# Patient Record
Sex: Male | Born: 1950 | Race: Black or African American | Hispanic: No | Marital: Single | Smoking: Never smoker
Health system: Southern US, Community
[De-identification: ages and names within clinical notes are randomized; demographics above are authoritative.]

## PROBLEM LIST (undated history)

## (undated) DIAGNOSIS — E785 Hyperlipidemia, unspecified: Secondary | ICD-10-CM

## (undated) DIAGNOSIS — M169 Osteoarthritis of hip, unspecified: Secondary | ICD-10-CM

## (undated) DIAGNOSIS — I10 Essential (primary) hypertension: Secondary | ICD-10-CM

## (undated) DIAGNOSIS — H539 Unspecified visual disturbance: Secondary | ICD-10-CM

## (undated) DIAGNOSIS — S82409A Unspecified fracture of shaft of unspecified fibula, initial encounter for closed fracture: Secondary | ICD-10-CM

## (undated) DIAGNOSIS — K047 Periapical abscess without sinus: Secondary | ICD-10-CM

## (undated) HISTORY — DX: Unspecified visual disturbance: H53.9

## (undated) HISTORY — PX: TONSILLECTOMY: SUR1361

## (undated) HISTORY — DX: Hyperlipidemia, unspecified: E78.5

## (undated) HISTORY — DX: Osteoarthritis of hip, unspecified: M16.9

## (undated) HISTORY — DX: Unspecified fracture of shaft of unspecified fibula, initial encounter for closed fracture: S82.409A

---

## 2002-02-06 ENCOUNTER — Ambulatory Visit (HOSPITAL_COMMUNITY): Admission: RE | Admit: 2002-02-06 | Discharge: 2002-02-06 | Payer: Self-pay | Admitting: *Deleted

## 2002-02-06 HISTORY — PX: COLONOSCOPY: SHX174

## 2008-03-24 ENCOUNTER — Ambulatory Visit: Payer: Self-pay | Admitting: Internal Medicine

## 2008-03-24 LAB — CONVERTED CEMR LAB
AST: 29 units/L (ref 0–37)
BUN: 10 mg/dL (ref 6–23)
Calcium: 9.7 mg/dL (ref 8.4–10.5)
Chloride: 102 meq/L (ref 96–112)
Cholesterol: 186 mg/dL (ref 0–200)
Creatinine, Ser: 0.88 mg/dL (ref 0.40–1.50)
HDL: 75 mg/dL (ref >39)
Total CHOL/HDL Ratio: 2.5

## 2008-05-11 ENCOUNTER — Ambulatory Visit: Payer: Self-pay | Admitting: Internal Medicine

## 2008-05-12 ENCOUNTER — Ambulatory Visit: Payer: Self-pay | Admitting: *Deleted

## 2008-05-14 ENCOUNTER — Ambulatory Visit: Payer: Self-pay | Admitting: Internal Medicine

## 2008-06-24 ENCOUNTER — Ambulatory Visit: Payer: Self-pay | Admitting: Internal Medicine

## 2008-07-21 ENCOUNTER — Ambulatory Visit: Payer: Self-pay | Admitting: Internal Medicine

## 2008-08-18 ENCOUNTER — Ambulatory Visit: Payer: Self-pay | Admitting: Internal Medicine

## 2008-09-28 ENCOUNTER — Ambulatory Visit: Payer: Self-pay | Admitting: Internal Medicine

## 2008-10-28 ENCOUNTER — Ambulatory Visit: Payer: Self-pay | Admitting: Internal Medicine

## 2008-12-22 ENCOUNTER — Ambulatory Visit: Payer: Self-pay | Admitting: Internal Medicine

## 2009-01-19 ENCOUNTER — Ambulatory Visit: Payer: Self-pay | Admitting: Internal Medicine

## 2009-02-23 ENCOUNTER — Ambulatory Visit: Payer: Self-pay | Admitting: Internal Medicine

## 2009-08-16 ENCOUNTER — Ambulatory Visit: Payer: Self-pay | Admitting: Internal Medicine

## 2010-03-16 ENCOUNTER — Ambulatory Visit: Payer: Self-pay | Admitting: Internal Medicine

## 2010-03-16 LAB — CONVERTED CEMR LAB
BUN: 14 mg/dL (ref 6–23)
CO2: 30 meq/L (ref 19–32)
Chloride: 98 meq/L (ref 96–112)
Glucose, Bld: 89 mg/dL (ref 70–99)
Potassium: 3.8 meq/L (ref 3.5–5.3)

## 2010-11-13 ENCOUNTER — Encounter (INDEPENDENT_AMBULATORY_CARE_PROVIDER_SITE_OTHER): Payer: Self-pay | Admitting: Family Medicine

## 2010-11-13 LAB — CONVERTED CEMR LAB
BUN: 8 mg/dL (ref 6–23)
CO2: 27 meq/L (ref 19–32)
Calcium: 9.3 mg/dL (ref 8.4–10.5)
Chloride: 102 meq/L (ref 96–112)
Creatinine, Ser: 0.86 mg/dL (ref 0.40–1.50)
Glucose, Bld: 84 mg/dL (ref 70–99)
Vit D, 25-Hydroxy: 12 ng/mL — ABNORMAL LOW (ref 30–89)

## 2010-12-26 ENCOUNTER — Ambulatory Visit (HOSPITAL_COMMUNITY)
Admission: RE | Admit: 2010-12-26 | Discharge: 2010-12-26 | Disposition: A | Discharge status: Home / Self Care | Payer: Self-pay | Source: Ambulatory Visit | Attending: Family Medicine | Admitting: Family Medicine

## 2010-12-26 DIAGNOSIS — I517 Cardiomegaly: Secondary | ICD-10-CM

## 2010-12-26 DIAGNOSIS — I10 Essential (primary) hypertension: Secondary | ICD-10-CM | POA: Insufficient documentation

## 2010-12-26 DIAGNOSIS — I079 Rheumatic tricuspid valve disease, unspecified: Secondary | ICD-10-CM | POA: Insufficient documentation

## 2010-12-26 DIAGNOSIS — I059 Rheumatic mitral valve disease, unspecified: Secondary | ICD-10-CM | POA: Insufficient documentation

## 2010-12-26 DIAGNOSIS — R9431 Abnormal electrocardiogram [ECG] [EKG]: Secondary | ICD-10-CM | POA: Insufficient documentation

## 2011-01-26 NOTE — Procedures (Signed)
[facility]  Patient:    Frank [name], Frank [name] Visit Number: [id number] MRN: [id number]          Service Type: END Location: ENDO Attending Physician:  Sabino Gasser Dictated by:   Sabino Gasser, M.D. Proc. Date: 02/06/02 Admit Date:  02/06/2002                             Procedure Report  PROCEDURE:  Colonoscopy.  INDICATIONS:  Colon cancer screening, rectal bleeding.  ANESTHESIA:  Demerol 75, Versed 6 mg.  DESCRIPTION OF PROCEDURE:  With the patient mildly sedated in the left lateral decubitus position, a rectal exam was performed which was unremarkable. Subsequently, the Olympus videoscopic colonoscope was inserted into the rectum and passed under direct vision to the cecum, identified by the ileocecal valve and appendiceal orifice, both of which were photographed.  From this point, the colonoscope was slowly withdrawn, taking circumferential views of the entire colonic mucosa, stopping only in the rectum which appeared normal on direct and showed hemorrhoids on retroflexed view.  The endoscope was straightened and withdrawn.  The patients vital signs and pulse oximeter remained stable.  The patient tolerated the procedure well without apparent complications.  FINDINGS:  Internal hemorrhoids, otherwise unremarkable examination to the cecum. Dictated by:   Sabino Gasser, M.D. Attending Physician:  Sabino Gasser DD:  02/06/02 TD:  02/07/02 Job: [id number]

## 2011-04-23 ENCOUNTER — Ambulatory Visit (HOSPITAL_COMMUNITY)
Admission: RE | Admit: 2011-04-23 | Discharge: 2011-04-23 | Disposition: A | Discharge status: Home / Self Care | Payer: Self-pay | Source: Ambulatory Visit | Attending: Family Medicine | Admitting: Family Medicine

## 2011-04-23 ENCOUNTER — Other Ambulatory Visit (HOSPITAL_COMMUNITY): Payer: Self-pay | Admitting: Family Medicine

## 2011-04-23 ENCOUNTER — Other Ambulatory Visit: Payer: Self-pay | Admitting: Family Medicine

## 2011-04-23 DIAGNOSIS — M169 Osteoarthritis of hip, unspecified: Secondary | ICD-10-CM | POA: Insufficient documentation

## 2011-04-23 DIAGNOSIS — M25552 Pain in left hip: Secondary | ICD-10-CM

## 2011-04-23 DIAGNOSIS — M1610 Unilateral primary osteoarthritis, unspecified hip: Secondary | ICD-10-CM | POA: Insufficient documentation

## 2011-04-26 ENCOUNTER — Ambulatory Visit (HOSPITAL_COMMUNITY)
Admission: RE | Admit: 2011-04-26 | Discharge: 2011-04-26 | Disposition: A | Discharge status: Home / Self Care | Payer: Self-pay | Source: Ambulatory Visit | Attending: Family Medicine | Admitting: Family Medicine

## 2011-04-26 DIAGNOSIS — M169 Osteoarthritis of hip, unspecified: Secondary | ICD-10-CM | POA: Insufficient documentation

## 2011-04-26 DIAGNOSIS — M25552 Pain in left hip: Secondary | ICD-10-CM

## 2011-04-26 DIAGNOSIS — M1610 Unilateral primary osteoarthritis, unspecified hip: Secondary | ICD-10-CM | POA: Insufficient documentation

## 2011-09-11 DIAGNOSIS — S82409A Unspecified fracture of shaft of unspecified fibula, initial encounter for closed fracture: Secondary | ICD-10-CM

## 2011-09-11 HISTORY — DX: Unspecified fracture of shaft of unspecified fibula, initial encounter for closed fracture: S82.409A

## 2011-11-05 ENCOUNTER — Other Ambulatory Visit (HOSPITAL_COMMUNITY): Payer: Self-pay | Admitting: Family Medicine

## 2011-11-05 ENCOUNTER — Ambulatory Visit (HOSPITAL_COMMUNITY)
Admission: RE | Admit: 2011-11-05 | Discharge: 2011-11-05 | Disposition: A | Discharge status: Home / Self Care | Payer: Self-pay | Source: Ambulatory Visit | Attending: Family Medicine | Admitting: Family Medicine

## 2011-11-05 DIAGNOSIS — Z9181 History of falling: Secondary | ICD-10-CM | POA: Insufficient documentation

## 2011-11-05 DIAGNOSIS — M25579 Pain in unspecified ankle and joints of unspecified foot: Secondary | ICD-10-CM | POA: Insufficient documentation

## 2011-11-05 DIAGNOSIS — R52 Pain, unspecified: Secondary | ICD-10-CM

## 2012-08-17 ENCOUNTER — Emergency Department (HOSPITAL_COMMUNITY)
Admission: EM | Admit: 2012-08-17 | Discharge: 2012-08-17 | Disposition: A | Discharge status: Home / Self Care | Payer: Self-pay | Attending: Emergency Medicine | Admitting: Emergency Medicine

## 2012-08-17 ENCOUNTER — Encounter (HOSPITAL_COMMUNITY): Payer: Self-pay | Admitting: Emergency Medicine

## 2012-08-17 DIAGNOSIS — Z79899 Other long term (current) drug therapy: Secondary | ICD-10-CM | POA: Insufficient documentation

## 2012-08-17 DIAGNOSIS — K047 Periapical abscess without sinus: Secondary | ICD-10-CM | POA: Insufficient documentation

## 2012-08-17 DIAGNOSIS — I10 Essential (primary) hypertension: Secondary | ICD-10-CM | POA: Insufficient documentation

## 2012-08-17 DIAGNOSIS — R220 Localized swelling, mass and lump, head: Secondary | ICD-10-CM | POA: Insufficient documentation

## 2012-08-17 DIAGNOSIS — R221 Localized swelling, mass and lump, neck: Secondary | ICD-10-CM | POA: Insufficient documentation

## 2012-08-17 HISTORY — DX: Essential (primary) hypertension: I10

## 2012-08-17 MED ORDER — HYDROCODONE-ACETAMINOPHEN 5-325 MG PO TABS: 2.0000 | ORAL_TABLET | ORAL | Status: DC | PRN

## 2012-08-17 MED ORDER — PENICILLIN V POTASSIUM 500 MG PO TABS: 500.0000 mg | ORAL_TABLET | Freq: Three times a day (TID) | ORAL | Status: DC

## 2012-08-17 NOTE — ED Notes (Signed)
Patient has large amount of swelling to right jaw x 1 week. The patient reports that he has had a "bad" tooth there

## 2012-08-17 NOTE — ED Provider Notes (Signed)
History     CSN: [id number]  Arrival date & time 08/17/12  0714   First MD Initiated Contact with Patient 08/17/12 [phone]      Chief Complaint  Patient presents with  . Oral Swelling  . Dental Pain     HPI Patient has large amount of swelling to right jaw x 1 week. The patient reports that he has had a "bad" tooth there   Past Medical History  Diagnosis Date  . Hypertension     History reviewed. No pertinent past surgical history.  No family history on file.  History  Substance Use Topics  . Smoking status: Never Smoker   . Smokeless tobacco: Not on file  . Alcohol Use: No      Review of Systems All other systems reviewed and are negative Allergies  Review of patient's allergies indicates no known allergies.  Home Medications   Current Outpatient Rx  Name  Route  Sig  Dispense  Refill  . ACETAMINOPHEN 500 MG PO TABS   Oral   Take 1,000 mg by mouth every 6 (six) hours as needed. Pain         . DILTIAZEM HCL ER BEADS 240 MG PO CP24   Oral   Take 240 mg by mouth daily.         Marland Kitchen HYDROCHLOROTHIAZIDE 25 MG PO TABS   Oral   Take 25 mg by mouth daily.         Marland Kitchen METOPROLOL SUCCINATE ER 100 MG PO TB24   Oral   Take 100 mg by mouth daily. Take with or immediately following a meal.         . NAPROXEN SODIUM 220 MG PO TABS   Oral   Take 440 mg by mouth 2 (two) times daily with a meal.         . HYDROCODONE-ACETAMINOPHEN 5-325 MG PO TABS   Oral   Take 2 tablets by mouth every 4 (four) hours as needed for pain.   10 tablet   0   . PENICILLIN V POTASSIUM 500 MG PO TABS   Oral   Take 1 tablet (500 mg total) by mouth 3 (three) times daily.   30 tablet   0     BP 154/98  Pulse 121  Temp 97.5 F (36.4 C) (Oral)  Resp 18  SpO2 97%  Physical Exam  Nursing note and vitals reviewed. Constitutional: He is oriented to person, place, and time. He appears well-developed and well-nourished. No distress.  HENT:  Head: Normocephalic and atraumatic.    Mouth/Throat: Uvula is midline, oropharynx is clear and moist and mucous membranes are normal. Dental abscesses and dental caries present.    Eyes: Pupils are equal, round, and reactive to light.  Neck: Normal range of motion.  Cardiovascular: Normal rate and intact distal pulses.   Pulmonary/Chest: No respiratory distress.  Abdominal: Normal appearance. He exhibits no distension.  Musculoskeletal: Normal range of motion.  Neurological: He is alert and oriented to person, place, and time. No cranial nerve deficit.  Skin: Skin is warm and dry. No rash noted.  Psychiatric: He has a normal mood and affect. His behavior is normal.    ED Course  Procedures (including critical care time)  Labs Reviewed - No data to display No results found.   1. Abscessed tooth       MDM         Nelia Shi, MD 08/17/12 [phone]

## 2012-08-17 NOTE — ED Notes (Signed)
Pt sts lower right dental pain, facial swelling and difficulty chewing x 1 week.  Denies difficulty breathing.  Sts he does not have a dentist.  Pain score 10/10.

## 2012-08-20 ENCOUNTER — Inpatient Hospital Stay (HOSPITAL_COMMUNITY)
Admission: EM | Admit: 2012-08-20 | Discharge: 2012-08-27 | DRG: 159 | Disposition: A | Discharge status: Home / Self Care | Payer: MEDICAID | Attending: Family Medicine | Admitting: Family Medicine

## 2012-08-20 ENCOUNTER — Encounter (HOSPITAL_COMMUNITY): Payer: Self-pay | Admitting: *Deleted

## 2012-08-20 ENCOUNTER — Emergency Department (HOSPITAL_COMMUNITY): Payer: Self-pay

## 2012-08-20 DIAGNOSIS — E876 Hypokalemia: Secondary | ICD-10-CM | POA: Diagnosis present

## 2012-08-20 DIAGNOSIS — K047 Periapical abscess without sinus: Secondary | ICD-10-CM

## 2012-08-20 DIAGNOSIS — I10 Essential (primary) hypertension: Secondary | ICD-10-CM

## 2012-08-20 DIAGNOSIS — M272 Inflammatory conditions of jaws: Secondary | ICD-10-CM

## 2012-08-20 DIAGNOSIS — Z23 Encounter for immunization: Secondary | ICD-10-CM

## 2012-08-20 DIAGNOSIS — E860 Dehydration: Secondary | ICD-10-CM | POA: Diagnosis present

## 2012-08-20 DIAGNOSIS — K113 Abscess of salivary gland: Secondary | ICD-10-CM

## 2012-08-20 HISTORY — DX: Periapical abscess without sinus: K04.7

## 2012-08-20 LAB — CBC WITH DIFFERENTIAL/PLATELET
Basophils Absolute: 0 10*3/uL (ref 0.0–0.1)
Basophils Relative: 0 % (ref 0–1)
Eosinophils Absolute: 0 10*3/uL (ref 0.0–0.7)
Eosinophils Relative: 0 % (ref 0–5)
HCT: 38.8 % — ABNORMAL LOW (ref 39.0–52.0)
Hemoglobin: 12.3 g/dL — ABNORMAL LOW (ref 13.0–17.0)
Lymphocytes Relative: 8 % — ABNORMAL LOW (ref 12–46)
Lymphs Abs: 1 10*3/uL (ref 0.7–4.0)
MCH: 27.2 pg (ref 26.0–34.0)
MCHC: 31.7 g/dL (ref 30.0–36.0)
MCV: 85.8 fL (ref 78.0–100.0)
Monocytes Absolute: 1.5 10*3/uL — ABNORMAL HIGH (ref 0.1–1.0)
Monocytes Relative: 11 % (ref 3–12)
Neutro Abs: 10.8 10*3/uL — ABNORMAL HIGH (ref 1.7–7.7)
Neutrophils Relative %: 81 % — ABNORMAL HIGH (ref 43–77)
Platelets: 316 10*3/uL (ref 150–400)
RBC: 4.52 MIL/uL (ref 4.22–5.81)
RDW: 13 % (ref 11.5–15.5)
WBC: 13.3 10*3/uL — ABNORMAL HIGH (ref 4.0–10.5)

## 2012-08-20 LAB — BASIC METABOLIC PANEL
BUN: 12 mg/dL (ref 6–23)
CO2: 31 mEq/L (ref 19–32)
Calcium: 9.6 mg/dL (ref 8.4–10.5)
Chloride: 96 mEq/L (ref 96–112)
Creatinine, Ser: 0.69 mg/dL (ref 0.50–1.35)
GFR calc Af Amer: >90 mL/min (ref >90)
GFR calc non Af Amer: >90 mL/min (ref >90)
Glucose, Bld: 140 mg/dL — ABNORMAL HIGH (ref 70–99)
Potassium: 3.4 mEq/L — ABNORMAL LOW (ref 3.5–5.1)
Sodium: 138 mEq/L (ref 135–145)

## 2012-08-20 LAB — COMPREHENSIVE METABOLIC PANEL
ALT: 27 U/L (ref 0–53)
AST: 31 U/L (ref 0–37)
Albumin: 2.8 g/dL — ABNORMAL LOW (ref 3.5–5.2)
Alkaline Phosphatase: 67 U/L (ref 39–117)
BUN: 11 mg/dL (ref 6–23)
CO2: 28 mEq/L (ref 19–32)
Calcium: 9 mg/dL (ref 8.4–10.5)
Chloride: 98 mEq/L (ref 96–112)
Creatinine, Ser: 0.67 mg/dL (ref 0.50–1.35)
GFR calc Af Amer: >90 mL/min (ref >90)
GFR calc non Af Amer: >90 mL/min (ref >90)
Glucose, Bld: 102 mg/dL — ABNORMAL HIGH (ref 70–99)
Potassium: 3.4 mEq/L — ABNORMAL LOW (ref 3.5–5.1)
Sodium: 136 mEq/L (ref 135–145)
Total Bilirubin: 0.9 mg/dL (ref 0.3–1.2)
Total Protein: 7.5 g/dL (ref 6.0–8.3)

## 2012-08-20 LAB — APTT: aPTT: 45 seconds — ABNORMAL HIGH (ref 24–37)

## 2012-08-20 LAB — PROTIME-INR
INR: 1.21 (ref 0.00–1.49)
Prothrombin Time: 15.1 seconds (ref 11.6–15.2)

## 2012-08-20 LAB — TYPE AND SCREEN
ABO/RH(D): O POS
Antibody Screen: NEGATIVE

## 2012-08-20 MED ORDER — IOHEXOL 300 MG/ML  SOLN
80.0000 mL | Freq: Once | INTRAMUSCULAR | Status: AC | PRN
  Administered 2012-08-20: 80 mL via INTRAVENOUS

## 2012-08-20 MED ORDER — SENNA 8.6 MG PO TABS
1.0000 | ORAL_TABLET | Freq: Two times a day (BID) | ORAL | Status: DC
  Administered 2012-08-20 – 2012-08-27 (×14): 8.6 mg via ORAL
  Filled 2012-08-20 (×19): qty 1

## 2012-08-20 MED ORDER — ACETAMINOPHEN 650 MG RE SUPP: 650.0000 mg | Freq: Four times a day (QID) | RECTAL | Status: DC | PRN

## 2012-08-20 MED ORDER — MORPHINE SULFATE 2 MG/ML IJ SOLN
2.0000 mg | INTRAMUSCULAR | Status: DC | PRN
  Administered 2012-08-20 – 2012-08-21 (×3): 2 mg via INTRAVENOUS
  Administered 2012-08-21: 4 mg via INTRAVENOUS
  Filled 2012-08-20 (×2): qty 1
  Filled 2012-08-20: qty 2
  Filled 2012-08-20: qty 1

## 2012-08-20 MED ORDER — METOPROLOL SUCCINATE ER 100 MG PO TB24
100.0000 mg | ORAL_TABLET | Freq: Every day | ORAL | Status: DC
  Administered 2012-08-21 – 2012-08-27 (×7): 100 mg via ORAL
  Filled 2012-08-20 (×7): qty 1

## 2012-08-20 MED ORDER — POLYETHYLENE GLYCOL 3350 17 G PO PACK
17.0000 g | PACK | Freq: Every day | ORAL | Status: DC | PRN
  Filled 2012-08-20: qty 1

## 2012-08-20 MED ORDER — DILTIAZEM HCL ER BEADS 240 MG PO CP24
240.0000 mg | ORAL_CAPSULE | Freq: Every day | ORAL | Status: DC
  Administered 2012-08-21 – 2012-08-27 (×7): 240 mg via ORAL
  Filled 2012-08-20 (×7): qty 1

## 2012-08-20 MED ORDER — CLINDAMYCIN PHOSPHATE 900 MG/50ML IV SOLN
900.0000 mg | Freq: Once | INTRAVENOUS | Status: AC
  Administered 2012-08-20: 900 mg via INTRAVENOUS
  Filled 2012-08-20: qty 50

## 2012-08-20 MED ORDER — CLINDAMYCIN PHOSPHATE 600 MG/50ML IV SOLN
600.0000 mg | Freq: Four times a day (QID) | INTRAVENOUS | Status: DC
  Administered 2012-08-21 – 2012-08-25 (×18): 600 mg via INTRAVENOUS
  Filled 2012-08-20 (×20): qty 50

## 2012-08-20 MED ORDER — SODIUM CHLORIDE 0.9 % IV BOLUS (SEPSIS)
1000.0000 mL | Freq: Once | INTRAVENOUS | Status: AC
  Administered 2012-08-20: 1000 mL via INTRAVENOUS

## 2012-08-20 MED ORDER — ONDANSETRON 4 MG PO TBDP
4.0000 mg | ORAL_TABLET | Freq: Once | ORAL | Status: AC
  Administered 2012-08-20: 4 mg via ORAL
  Filled 2012-08-20: qty 1

## 2012-08-20 MED ORDER — HYDROMORPHONE HCL PF 1 MG/ML IJ SOLN
1.0000 mg | Freq: Once | INTRAMUSCULAR | Status: AC
  Administered 2012-08-20: 1 mg via INTRAVENOUS
  Filled 2012-08-20: qty 1

## 2012-08-20 MED ORDER — ONDANSETRON HCL 4 MG/2ML IJ SOLN: 4.0000 mg | Freq: Four times a day (QID) | INTRAMUSCULAR | Status: DC | PRN

## 2012-08-20 MED ORDER — ONDANSETRON HCL 4 MG PO TABS: 4.0000 mg | ORAL_TABLET | Freq: Four times a day (QID) | ORAL | Status: DC | PRN

## 2012-08-20 MED ORDER — INFLUENZA VIRUS VACC SPLIT PF IM SUSP
0.5000 mL | INTRAMUSCULAR | Status: AC
  Filled 2012-08-20: qty 0.5

## 2012-08-20 MED ORDER — POTASSIUM CHLORIDE IN NACL 40-0.9 MEQ/L-% IV SOLN
INTRAVENOUS | Status: DC
  Administered 2012-08-20 – 2012-08-22 (×3): via INTRAVENOUS
  Filled 2012-08-20 (×7): qty 1000

## 2012-08-20 MED ORDER — ACETAMINOPHEN 325 MG PO TABS: 650.0000 mg | ORAL_TABLET | Freq: Four times a day (QID) | ORAL | Status: DC | PRN

## 2012-08-20 NOTE — ED Notes (Signed)
Attempted to call report x1.  Nurse and charge nurse unavailable, will call back in 5 minutes.

## 2012-08-20 NOTE — ED Provider Notes (Signed)
History     CSN: [id number]  Arrival date & time 08/20/12  1221   First MD Initiated Contact with Patient 08/20/12 1511      Chief Complaint  Patient presents with  . Abscess    (Consider location/radiation/quality/duration/timing/severity/associated sxs/prior treatment) HPI Comments: Frank [name] 61 y.o. male   The chief complaint is: Patient presents with:   Abscess   61 year old male presents to emergency department with chief complaint of right tooth abscess.  He was seen 2 days ago at [facility] long emergency department and given Pen-Vee K 500 mg 3 times a day as well as Norco.  He has had no resolution of symptoms but no worsening.  His airway is patent he has no difficulty swallowing.  He was unable to obtain outside followup for drainage of his abscess.  Denies fevers, chills, myalgias, arthralgias. Denies DOE, SOB, chest tightness or pressure, radiation to left arm, jaw or back, or diaphoresis. Denies dysuria, flank pain, suprapubic pain, frequency, urgency, or hematuria. Denies headaches, light headedness, weakness, visual disturbances. Denies abdominal pain, nausea, vomiting, diarrhea or constipation.       Patient is a 61 y.o. male presenting with abscess. The history is provided by the patient and medical records. No language interpreter was used.  Abscess     Past Medical History  Diagnosis Date  . Hypertension     History reviewed. No pertinent past surgical history.  No family history on file.  History  Substance Use Topics  . Smoking status: Never Smoker   . Smokeless tobacco: Not on file  . Alcohol Use: No      Review of Systems  All other systems reviewed and are negative.  Ten systems are reviewed and are negative for acute change except as noted in the HPI   Allergies  Review of patient's allergies indicates no known allergies.  Home Medications   Current Outpatient Rx  Name  Route  Sig  Dispense  Refill  .  ACETAMINOPHEN 500 MG PO TABS   Oral   Take 1,000 mg by mouth every 6 (six) hours as needed. Pain         . DILTIAZEM HCL ER BEADS 240 MG PO CP24   Oral   Take 240 mg by mouth daily.         Marland Kitchen HYDROCHLOROTHIAZIDE 25 MG PO TABS   Oral   Take 25 mg by mouth daily.         Marland Kitchen HYDROCODONE-ACETAMINOPHEN 5-325 MG PO TABS   Oral   Take 2 tablets by mouth every 4 (four) hours as needed for pain.   10 tablet   0   . METOPROLOL SUCCINATE ER 100 MG PO TB24   Oral   Take 100 mg by mouth daily. Take with or immediately following a meal.         . NAPROXEN SODIUM 220 MG PO TABS   Oral   Take 440 mg by mouth 2 (two) times daily with a meal.         . PENICILLIN V POTASSIUM 500 MG PO TABS   Oral   Take 1 tablet (500 mg total) by mouth 3 (three) times daily.   30 tablet   0     BP 119/87  Pulse 110  Temp 98.1 F (36.7 C) (Oral)  Resp 18  SpO2 97%  Physical Exam  Nursing note and vitals reviewed. Constitutional: He appears well-developed and well-nourished. No distress.  HENT:  Head: Normocephalic and  atraumatic.         Patient has significant poor dentition.  He has erythema and swelling of the vehicle move toes that extends back to the posterior jaw line.  Uvula is midline and nonerythematous.  The tonsil do not have erythema his airway is patent  Eyes: Conjunctivae normal are normal. No scleral icterus.  Neck: Normal range of motion. Neck supple.  Cardiovascular: Normal rate, regular rhythm and normal heart sounds.   Pulmonary/Chest: Effort normal and breath sounds normal. No respiratory distress.  Abdominal: Soft. There is no tenderness.  Musculoskeletal: He exhibits no edema.  Neurological: He is alert.  Skin: Skin is warm and dry. He is not diaphoretic.  Psychiatric: His behavior is normal.    ED Course  Procedures (including critical care time)  Labs Reviewed  CBC WITH DIFFERENTIAL - Abnormal; Notable for the following:    WBC 13.3 (*)     Hemoglobin  12.3 (*)     HCT 38.8 (*)     Neutrophils Relative 81 (*)     Neutro Abs 10.8 (*)     Lymphocytes Relative 8 (*)     Monocytes Absolute 1.5 (*)     All other components within normal limits  BASIC METABOLIC PANEL - Abnormal; Notable for the following:    Potassium 3.4 (*)     Glucose, Bld 140 (*)     All other components within normal limits   Ct Maxillofacial W/cm  08/20/2012  *RADIOLOGY REPORT*  Clinical Data: Right dental abscess with neck extension.  CT MAXILLOFACIAL WITH CONTRAST  Technique:  Multidetector CT imaging of the maxillofacial structures was performed with intravenous contrast. Multiplanar CT image reconstructions were also generated.  Contrast: 80mL OMNIPAQUE IOHEXOL 300 MG/ML  SOLN  Comparison: None.  Findings: Large fluid collection in the right masticator space compatible with an abscess.  This has central fluid density and enhancement of the wall with irregular margins.  This extends into the masseter  muscle and into the pterygoid muscles.  The mass extends posterior to the angle of the mandible were it measures approximately 5.4 x 3.2 cm.  There is additional abscess extending medial to the mandible.  There is mass effect and displacement of the oropharynx to the left.  No compromise of the airway.  The mass is adjacent to and  displaces the right submandibular gland.  The floor of the mouth appears intact.  The paranasal sinuses are clear.  There is lucency in the right lower third molar compatible with dental infection.  No definite peri apical abscess is present. Nevertheless, based on the location of the abscess, it is most likely dental in origin.  No recent extraction is identified.  IMPRESSION: Large abscess in the masticator space on the right.  Based on the location, this is most likely dental in origin however no definite peri apical abscess is present.  Dental caries are present in the right lower third molar.  The oropharynx is displaced to the left but there is no  compromise of the airway.   Original Report Authenticated By: Janeece Riggers, M.D.      No diagnosis found.    MDM  3:30 PM BP 119/87  Pulse 110  Temp 98.1 F (36.7 C) (Oral)  Resp 18  SpO2 97% Patient with dental abscess he was unable to obtain outpatient followup.  He is gross deformity of his right lower jaw to 2 swelling.  Patient does have tachycardia and leukocytosis of 13 but biters vital signs  are otherwise stable he is afebrile   7:12 PM patient with 5.5 x 3 cm mandibular abscess with extension to the posterior pharynx and mass effect.  I have begun 900 mg IV clindamycin.  I spoke with Dr. Geralyn Flash wil  come to see the patient but has asked that the hospitalist group to admit for observation and a period of "cooling off" before surgery.  Patient will be admitted by family medicine. Repeat dose ABX and OMF surgery consult.     Arthor Captain, PA-C 08/22/12 1022

## 2012-08-20 NOTE — H&P (Signed)
Family Medicine Teaching [facility] Admission History and Physical  Patient name: Frank [name] Medical record number: [id number] Date of birth: 12-25-1950 Age: 61 y.o. Gender: male  Primary Care Provider: Dow Adolph, MD  Chief Complaint: R cheek pain  History of Present Illness: Frank [name] is a 61 y.o. year old male presenting with 2 wk h/o R cheek pain. Pain and swelling started 2 weeks ago. Pt w/ history of previous cavities. Pt receives [facility] care at health department but was told on initial evaluation that they were unable to treat his condition. Pt presented to [facility] on 12/8 after his pain and swelling progressed. Pt evaluated and treated w/ Pen VK and vicodin. Swelling and pain have progressed since that time. Pt now w/ limited movement of jaw and decreased PO. Denies fever, rash, n/v, syncope, lightheadedenss, SOB, CP, HA, palpitations, general malaise, hematemasis, or purulent discharge from mouth.   Review Of Systems: Per HPI all other systems negative  There is no problem list on file for this patient.  Past Medical History: Past Medical History  Diagnosis Date  . Hypertension     Past Surgical History: History reviewed. No pertinent past surgical history.  Social History: History   Social History  . Marital Status: Single    Spouse Name: N/A    Number of Children: N/A  . Years of Education: N/A   Social History Main Topics  . Smoking status: Never Smoker   . Smokeless tobacco: None  . Alcohol Use: No  . Drug Use: No  . Sexually Active:    Other Topics Concern  . None   Social History Narrative  . None    Family History: Father and mother w/ HTN  Allergies: No Known Allergies  Current Facility-Administered Medications  Medication Dose Route Frequency Provider Last Rate Last Dose  . 0.9 % NaCl with KCl 40 mEq / L  infusion   Intravenous Continuous Ozella Rocks, MD      . acetaminophen (TYLENOL) tablet 650 mg  650  mg Oral Q6H PRN Ozella Rocks, MD       Or  . acetaminophen (TYLENOL) suppository 650 mg  650 mg Rectal Q6H PRN Ozella Rocks, MD      . clindamycin (CLEOCIN) 450 mg in dextrose 5 % 50 mL IVPB  450 mg Intravenous Q6H Ozella Rocks, MD      . [address] clindamycin (CLEOCIN) IVPB 900 mg  900 mg Intravenous Once Arthor Captain, PA-C   900 mg at 08/20/12 1914  . diltiazem (TIAZAC) 24 hr capsule 240 mg  240 mg Oral Daily Ozella Rocks, MD      . [address] HYDROmorphone (DILAUDID) injection 1 mg  1 mg Intravenous Once Arthor Captain, PA-C   1 mg at 08/20/12 1642  . [COMPLETED] iohexol (OMNIPAQUE) 300 MG/ML solution 80 mL  80 mL Intravenous Once PRN Medication Radiologist, MD   80 mL at 08/20/12 1827  . metoprolol succinate (TOPROL-XL) 24 hr tablet 100 mg  100 mg Oral Daily Ozella Rocks, MD      . morphine 2 MG/ML injection 2-4 mg  2-4 mg Intravenous Q2H PRN Ozella Rocks, MD      . ondansetron [facility]) tablet 4 mg  4 mg Oral Q6H PRN Ozella Rocks, MD       Or  . ondansetron [facility]) injection 4 mg  4 mg Intravenous Q6H PRN Ozella Rocks, MD      . [COMPLETED] ondansetron (ZOFRAN-ODT) disintegrating  tablet 4 mg  4 mg Oral Once Arthor Captain, PA-C   4 mg at 08/20/12 1641  . polyethylene glycol (MIRALAX / GLYCOLAX) packet 17 g  17 g Oral Daily PRN Ozella Rocks, MD      . senna [facility]) tablet 8.6 mg  1 tablet Oral BID Ozella Rocks, MD      . [address] sodium chloride 0.9 % bolus 1,000 mL  1,000 mL Intravenous Once Arthor Captain, PA-C   1,000 mL at 08/20/12 1914   Current Outpatient Prescriptions  Medication Sig Dispense Refill  . diltiazem (TIAZAC) 240 MG 24 hr capsule Take 240 mg by mouth daily.      . hydrochlorothiazide (HYDRODIURIL) 25 MG tablet Take 25 mg by mouth daily.      Marland Kitchen HYDROcodone-acetaminophen (NORCO/VICODIN) 5-325 MG per tablet Take 2 tablets by mouth every 4 (four) hours as needed for pain.  10 tablet  0  . metoprolol succinate (TOPROL-XL) 100 MG 24 hr  tablet Take 100 mg by mouth daily. Take with or immediately following a meal.      . penicillin v potassium (VEETID) 250 MG tablet Take 500 mg by mouth 3 (three) times daily. Started 12.8.13 for 10 days ending 12.18.13         Physical Exam: Filed Vitals:   08/20/12 2045  BP: 140/93  Pulse: 95  Temp:   Resp:    General: alert, cooperative, appears stated age and mild distress HEENT: EOMI, Dry mucous membrane, R jaw/cheek swelling w/ induration several cm beyond L side w/ tenderness to palpation. Trismus w/ pt able to open jaw approximately 2.5cm w/ purulent discharge in R peridontal region near molars (unable do delineate exact location due to inability to open jaw) Heart: S1, S2 normal, no murmur, rub or gallop, regular rate and rhythm Lungs: clear to auscultation, no wheezes or rales and unlabored breathing Abdomen: abdomen is soft without significant tenderness, masses, organomegaly or guarding Extremities: extremities normal, atraumatic, no cyanosis or edema Musculoskeletal: no joint tenderness, deformity or swelling Skin:no rashes, no ecchymoses, no petechiae, no wounds Neurology: normal without focal findings, mental status, speech normal, alert and oriented x3 and PERLA  Labs and Imaging:   Results for orders placed during the hospital encounter of 08/20/12 (from the past 24 hour(s))  CBC WITH DIFFERENTIAL     Status: Abnormal   Collection Time   08/20/12  1:14 PM      Component Value Range   WBC 13.3 (*) 4.0 - 10.5 K/uL   RBC 4.52  4.22 - 5.81 MIL/uL   Hemoglobin 12.3 (*) 13.0 - 17.0 g/dL   HCT 56.2 (*) 13.0 - 86.5 %   MCV 85.8  78.0 - 100.0 fL   MCH 27.2  26.0 - 34.0 pg   MCHC 31.7  30.0 - 36.0 g/dL   RDW 78.4  69.6 - 29.5 %   Platelets 316  150 - 400 K/uL   Neutrophils Relative 81 (*) 43 - 77 %   Neutro Abs 10.8 (*) 1.7 - 7.7 K/uL   Lymphocytes Relative 8 (*) 12 - 46 %   Lymphs Abs 1.0  0.7 - 4.0 K/uL   Monocytes Relative 11  3 - 12 %   Monocytes Absolute 1.5  (*) 0.1 - 1.0 K/uL   Eosinophils Relative 0  0 - 5 %   Eosinophils Absolute 0.0  0.0 - 0.7 K/uL   Basophils Relative 0  0 - 1 %   Basophils Absolute 0.0  0.0 -  0.1 K/uL  BASIC METABOLIC PANEL     Status: Abnormal   Collection Time   08/20/12  1:14 PM      Component Value Range   Sodium 138  135 - 145 mEq/L   Potassium 3.4 (*) 3.5 - 5.1 mEq/L   Chloride 96  96 - 112 mEq/L   CO2 31  19 - 32 mEq/L   Glucose, Bld 140 (*) 70 - 99 mg/dL   BUN 12  6 - 23 mg/dL   Creatinine, Ser 1.61  0.50 - 1.35 mg/dL   Calcium 9.6  8.4 - 09.6 mg/dL   GFR calc non Af Amer >90  >90 mL/min   GFR calc Af Amer >90  >90 mL/min   Ct Maxillofacial W/cm  08/20/2012    IMPRESSION: Large abscess in the masticator space on the right.  Based on the location, this is most likely dental in origin however no definite peri apical abscess is present.  Dental caries are present in the right lower third molar.  The oropharynx is displaced to the left but there is no compromise of the airway.   Original Report Authenticated By: Janeece Riggers, M.D.      Assessment and Plan: Frank [name] is a 61 y.o. year old male w/ h/o HTN and previous treatment for dental infection presenting with impressive dental abscess.  1. Abscess: 5.4x3.2cm masticator abscess w/ dental origin. Pt likely did not respond to ABX from 12/8 as abscess was so large and walled off. Pt is stable but will need surgical drainage by ENT in order to resolve.  - Admit to inpt - NPO, for likely surgery - Coags and Type and Screen - Morphine for pain - Clindamycin IV - Case mgt as pt uninsured and will likely need Rx and financial assistance at time of DC - blood Cx x2  2. CV: Hemodynamically stable BP and HR stable. No evidence of septic shock at this time. EKG unremarkable - Continue home Metop and Dilt - VS per floor protocol  3. FEN/GI: Pt nutritional status decreased over last few days and dehydrated at time of admission. Pt received 1L NS bolus  in ED. Hypokalemic on labs - IVF NS w/ KCL as pt dehydrated - CMET - diet as above  4. Prophylaxis: No heparin as likely surgery tonight vs in the am - SCD  5. Disposition: pending surgery and recovery.   Signed: Shelly Flatten, M.D. Family Medicine Resident PGY-2 [phone] 08/20/2012 9:02 PM

## 2012-08-20 NOTE — ED Notes (Signed)
Pt has right lower tooth abscess that has extended to neck area.  No airway issues and talks in complete sentences.

## 2012-08-21 ENCOUNTER — Inpatient Hospital Stay (HOSPITAL_COMMUNITY): Payer: MEDICAID

## 2012-08-21 ENCOUNTER — Inpatient Hospital Stay (HOSPITAL_COMMUNITY): Payer: MEDICAID | Admitting: Anesthesiology

## 2012-08-21 ENCOUNTER — Encounter (HOSPITAL_COMMUNITY): Payer: Self-pay | Admitting: Anesthesiology

## 2012-08-21 ENCOUNTER — Encounter (HOSPITAL_COMMUNITY)
Admission: EM | Disposition: A | Discharge status: Home / Self Care | Payer: Self-pay | Source: Home / Self Care | Attending: Family Medicine

## 2012-08-21 DIAGNOSIS — R221 Localized swelling, mass and lump, neck: Secondary | ICD-10-CM

## 2012-08-21 DIAGNOSIS — R220 Localized swelling, mass and lump, head: Secondary | ICD-10-CM

## 2012-08-21 HISTORY — PX: INCISION AND DRAINAGE ABSCESS: SHX5864

## 2012-08-21 LAB — ABO/RH: ABO/RH(D): O POS

## 2012-08-21 SURGERY — INCISION AND DRAINAGE, ABSCESS
Anesthesia: General | Site: Face | Wound class: Dirty or Infected

## 2012-08-21 MED ORDER — FENTANYL CITRATE 0.05 MG/ML IJ SOLN
INTRAMUSCULAR | Status: DC | PRN
  Administered 2012-08-21 (×2): 50 ug via INTRAVENOUS

## 2012-08-21 MED ORDER — 0.9 % SODIUM CHLORIDE (POUR BTL) OPTIME
TOPICAL | Status: DC | PRN
  Administered 2012-08-21: 1000 mL

## 2012-08-21 MED ORDER — ARTIFICIAL TEARS OP OINT
TOPICAL_OINTMENT | OPHTHALMIC | Status: DC | PRN
  Administered 2012-08-21: 1 via OPHTHALMIC

## 2012-08-21 MED ORDER — LIDOCAINE-EPINEPHRINE (PF) 1 %-1:200000 IJ SOLN
INTRAMUSCULAR | Status: AC
  Filled 2012-08-21: qty 10

## 2012-08-21 MED ORDER — BACITRACIN ZINC 500 UNIT/GM EX OINT
TOPICAL_OINTMENT | CUTANEOUS | Status: DC | PRN
  Administered 2012-08-21: 1 via TOPICAL

## 2012-08-21 MED ORDER — LIDOCAINE-EPINEPHRINE 1 %-1:100000 IJ SOLN
INTRAMUSCULAR | Status: DC | PRN
  Administered 2012-08-21: 30 mL

## 2012-08-21 MED ORDER — BACITRACIN ZINC 500 UNIT/GM EX OINT
TOPICAL_OINTMENT | CUTANEOUS | Status: AC
  Filled 2012-08-21: qty 15

## 2012-08-21 MED ORDER — ONDANSETRON HCL 4 MG/2ML IJ SOLN: 4.0000 mg | Freq: Four times a day (QID) | INTRAMUSCULAR | Status: DC | PRN

## 2012-08-21 MED ORDER — LIDOCAINE HCL (CARDIAC) 20 MG/ML IV SOLN
INTRAVENOUS | Status: DC | PRN
  Administered 2012-08-21: 60 mg via INTRAVENOUS

## 2012-08-21 MED ORDER — LACTATED RINGERS IV SOLN
INTRAVENOUS | Status: DC
  Administered 2012-08-21: 18:00:00 via INTRAVENOUS

## 2012-08-21 MED ORDER — MIDAZOLAM HCL 5 MG/5ML IJ SOLN
INTRAMUSCULAR | Status: DC | PRN
  Administered 2012-08-21: 2 mg via INTRAVENOUS

## 2012-08-21 MED ORDER — DOXYCYCLINE HYCLATE 100 MG IV SOLR
100.0000 mg | Freq: Two times a day (BID) | INTRAVENOUS | Status: DC
  Administered 2012-08-21 – 2012-08-25 (×8): 100 mg via INTRAVENOUS
  Filled 2012-08-21 (×10): qty 100

## 2012-08-21 MED ORDER — OXYCODONE HCL 5 MG/5ML PO SOLN: 5.0000 mg | Freq: Once | ORAL | Status: DC | PRN

## 2012-08-21 MED ORDER — SODIUM CHLORIDE 0.9 % IR SOLN
Status: DC | PRN
  Administered 2012-08-21: 19:00:00

## 2012-08-21 MED ORDER — DEXAMETHASONE SODIUM PHOSPHATE 10 MG/ML IJ SOLN
10.0000 mg | Freq: Three times a day (TID) | INTRAMUSCULAR | Status: AC
  Administered 2012-08-21 – 2012-08-22 (×3): 10 mg via INTRAVENOUS
  Filled 2012-08-21 (×3): qty 1

## 2012-08-21 MED ORDER — HYDROCODONE-ACETAMINOPHEN 7.5-500 MG/15ML PO SOLN
15.0000 mL | ORAL | Status: DC | PRN
  Administered 2012-08-23: 15 mL via ORAL
  Filled 2012-08-21: qty 15

## 2012-08-21 MED ORDER — CLINDAMYCIN PHOSPHATE 600 MG/50ML IV SOLN
INTRAVENOUS | Status: DC | PRN
  Administered 2012-08-21: 600 mg via INTRAVENOUS

## 2012-08-21 MED ORDER — FENTANYL CITRATE 0.05 MG/ML IJ SOLN: 25.0000 ug | INTRAMUSCULAR | Status: DC | PRN

## 2012-08-21 MED ORDER — SUCCINYLCHOLINE CHLORIDE 20 MG/ML IJ SOLN
INTRAMUSCULAR | Status: DC | PRN
  Administered 2012-08-21: 140 mg via INTRAVENOUS

## 2012-08-21 MED ORDER — PROPOFOL 10 MG/ML IV BOLUS
INTRAVENOUS | Status: DC | PRN
  Administered 2012-08-21: 200 mg via INTRAVENOUS

## 2012-08-21 MED ORDER — OXYCODONE HCL 5 MG PO TABS: 5.0000 mg | ORAL_TABLET | Freq: Once | ORAL | Status: DC | PRN

## 2012-08-21 MED ORDER — PHENYLEPHRINE HCL 10 MG/ML IJ SOLN
INTRAMUSCULAR | Status: DC | PRN
  Administered 2012-08-21 (×2): 80 ug via INTRAVENOUS

## 2012-08-21 MED ORDER — LACTATED RINGERS IV SOLN
INTRAVENOUS | Status: DC | PRN
  Administered 2012-08-21: 18:00:00 via INTRAVENOUS

## 2012-08-21 MED ORDER — CHLORHEXIDINE GLUCONATE 0.12 % MT SOLN
15.0000 mL | Freq: Two times a day (BID) | OROMUCOSAL | Status: DC
  Administered 2012-08-21 – 2012-08-26 (×11): 15 mL via OROMUCOSAL
  Filled 2012-08-21 (×12): qty 15

## 2012-08-21 SURGICAL SUPPLY — 59 items
AIRSTRIP 4 3/4X3 1/4 7185 (GAUZE/BANDAGES/DRESSINGS) IMPLANT
APL SKNCLS STERI-STRIP NONHPOA (GAUZE/BANDAGES/DRESSINGS)
ATTRACTOMAT 16X20 MAGNETIC DRP (DRAPES) IMPLANT
BANDAGE CONFORM 2  STR LF (GAUZE/BANDAGES/DRESSINGS) IMPLANT
BANDAGE GAUZE ELAST BULKY 4 IN (GAUZE/BANDAGES/DRESSINGS) IMPLANT
BENZOIN TINCTURE PRP APPL 2/3 (GAUZE/BANDAGES/DRESSINGS) IMPLANT
BLADE SURG 10 STRL SS (BLADE) IMPLANT
BLADE SURG 15 STRL LF DISP TIS (BLADE) IMPLANT
BLADE SURG 15 STRL SS (BLADE)
BLADE SURG ROTATE 9660 (MISCELLANEOUS) IMPLANT
CANISTER SUCTION 2500CC (MISCELLANEOUS) IMPLANT
CATH ROBINSON RED A/P 16FR (CATHETERS) IMPLANT
CLEANER TIP ELECTROSURG 2X2 (MISCELLANEOUS) ×2 IMPLANT
CLOTH BEACON ORANGE TIMEOUT ST (SAFETY) ×2 IMPLANT
CONT SPEC 4OZ CLIKSEAL STRL BL (MISCELLANEOUS) ×2 IMPLANT
COVER SURGICAL LIGHT HANDLE (MISCELLANEOUS) ×2 IMPLANT
CRADLE DONUT ADULT HEAD (MISCELLANEOUS) IMPLANT
DECANTER SPIKE VIAL GLASS SM (MISCELLANEOUS) ×2 IMPLANT
DRAIN PENROSE 1/2X12 LTX STRL (WOUND CARE) ×2 IMPLANT
DRAIN PENROSE 1/4X12 LTX STRL (WOUND CARE) IMPLANT
DRAPE LAPAROTOMY TRNSV 102X78 (DRAPE) IMPLANT
DRSG EMULSION OIL 3X3 NADH (GAUZE/BANDAGES/DRESSINGS) IMPLANT
ELECT COATED BLADE 2.86 ST (ELECTRODE) ×2 IMPLANT
ELECT NDL TIP 2.8 STRL (NEEDLE) IMPLANT
ELECT NEEDLE TIP 2.8 STRL (NEEDLE) IMPLANT
ELECT REM PT RETURN 9FT ADLT (ELECTROSURGICAL) ×2
ELECTRODE REM PT RTRN 9FT ADLT (ELECTROSURGICAL) ×1 IMPLANT
GAUZE SPONGE 4X4 16PLY XRAY LF (GAUZE/BANDAGES/DRESSINGS) IMPLANT
GLOVE BIO SURGEON STRL SZ7 (GLOVE) ×2 IMPLANT
GLOVE BIOGEL PI IND STRL 7.5 (GLOVE) ×1 IMPLANT
GLOVE BIOGEL PI INDICATOR 7.5 (GLOVE) ×1
GOWN STRL NON-REIN LRG LVL3 (GOWN DISPOSABLE) ×4 IMPLANT
KIT BASIN OR (CUSTOM PROCEDURE TRAY) ×2 IMPLANT
KIT ROOM TURNOVER OR (KITS) ×2 IMPLANT
MARKER SKIN DUAL TIP RULER LAB (MISCELLANEOUS) ×2 IMPLANT
NDL HYPO 25GX1X1/2 BEV (NEEDLE) ×1 IMPLANT
NEEDLE HYPO 25GX1X1/2 BEV (NEEDLE) ×2 IMPLANT
NS IRRIG 1000ML POUR BTL (IV SOLUTION) ×2 IMPLANT
PAD ARMBOARD 7.5X6 YLW CONV (MISCELLANEOUS) ×4 IMPLANT
PENCIL BUTTON HOLSTER BLD 10FT (ELECTRODE) ×2 IMPLANT
SPONGE GAUZE 4X4 12PLY (GAUZE/BANDAGES/DRESSINGS) IMPLANT
SPONGE LAP 18X18 X RAY DECT (DISPOSABLE) ×2 IMPLANT
STRIP CLOSURE SKIN 1/2X4 (GAUZE/BANDAGES/DRESSINGS) ×2 IMPLANT
SUCTION FRAZIER TIP 10 FR DISP (SUCTIONS) ×2 IMPLANT
SUT MNCRL AB 4-0 PS2 18 (SUTURE) ×2 IMPLANT
SUT PROLENE 2 0 SH DA (SUTURE) ×1 IMPLANT
SUT VIC AB 2-0 SH 27 (SUTURE) ×2
SUT VIC AB 2-0 SH 27X BRD (SUTURE) ×1 IMPLANT
SUT VIC AB 3-0 SH 27 (SUTURE) ×2
SUT VIC AB 3-0 SH 27XBRD (SUTURE) ×1 IMPLANT
SWAB COLLECTION DEVICE MRSA (MISCELLANEOUS) IMPLANT
SYR BULB IRRIGATION 50ML (SYRINGE) ×2 IMPLANT
SYR CONTROL 10ML LL (SYRINGE) ×2 IMPLANT
TOWEL OR 17X24 6PK STRL BLUE (TOWEL DISPOSABLE) ×2 IMPLANT
TOWEL OR 17X26 10 PK STRL BLUE (TOWEL DISPOSABLE) ×2 IMPLANT
TRAY ENT MC OR (CUSTOM PROCEDURE TRAY) ×2 IMPLANT
TUBE ANAEROBIC SPECIMEN COL (MISCELLANEOUS) IMPLANT
WATER STERILE IRR 1000ML POUR (IV SOLUTION) ×2 IMPLANT
YANKAUER SUCT BULB TIP NO VENT (SUCTIONS) ×2 IMPLANT

## 2012-08-21 NOTE — Anesthesia Preprocedure Evaluation (Addendum)
Anesthesia Evaluation  Patient identified by MRN, date of birth, ID band Patient awake    Reviewed: Allergy & Precautions, H&P , NPO status , Patient's Chart, lab work & pertinent test results  Airway Mallampati: II  Neck ROM: full    Dental  (+) Poor Dentition and Dental Advisory Given   Pulmonary          Cardiovascular hypertension, Pt. on medications     Neuro/Psych    GI/Hepatic   Endo/Other    Renal/GU      Musculoskeletal   Abdominal   Peds  Hematology   Anesthesia Other Findings   Reproductive/Obstetrics                          Anesthesia Physical Anesthesia Plan  ASA: II  Anesthesia Plan: General   Post-op Pain Management:    Induction: Intravenous  Airway Management Planned: Oral ETT  Additional Equipment:   Intra-op Plan:   Post-operative Plan: Extubation in OR  Informed Consent: I have reviewed the patients History and Physical, chart, labs and discussed the procedure including the risks, benefits and alternatives for the proposed anesthesia with the patient or authorized representative who has indicated his/her understanding and acceptance.     Plan Discussed with: CRNA and Surgeon  Anesthesia Plan Comments:         Anesthesia Quick Evaluation

## 2012-08-21 NOTE — Anesthesia Postprocedure Evaluation (Signed)
Anesthesia Post Note  Patient: Frank [name]  Procedure(s) Performed: Procedure(s) (LRB): INCISION AND DRAINAGE ABSCESS (N/A)  Anesthesia type: general  Patient location: PACU  Post pain: Pain level controlled  Post assessment: Patient's Cardiovascular Status Stable  Last Vitals:  Filed Vitals:   08/21/12 1955  BP: 121/88  Pulse: 85  Temp: 36.7 C  Resp: 17    Post vital signs: Reviewed and stable  Level of consciousness: sedated  Complications: No apparent anesthesia complications

## 2012-08-21 NOTE — Op Note (Signed)
DATE OF OPERATION: 08/21/2012 7:25 PM Surgeon: Melvenia Beam Procedure Performed: 21501-Right incision and drainage of right deep neck abscess  PREOPERATIVE DIAGNOSIS: right deep neck abscess/right odontogenic abscess  POSTOPERATIVE DIAGNOSIS: right deep neck abscess/right odontogenic abscess SURGEON: Melvenia Beam ANESTHESIA: General endotracheal.  ESTIMATED BLOOD LOSS: Approximately 25 mL.  DRAINS: two right neck penrose drains SPECIMENS: right neck abscess cultures INDICATIONS: The patient is a 61yo with a history of right carious third mandibular molar that failed penicillin treatment. He developed trismus and a large odontogenic abscess and is taken to the OR for I&D. DESCRIPTION OF OPERATION: The patient was brought to the operating room and was placed in the supine position and was placed under general endotracheal anesthesia by anesthesiology using the Glidescope.   The right neck was palpated and a 3cm incision was drawn in a natural skin crease 2 fingerbreadths below the mandible. The neck was prepped, draped in the incision was injected with 1% lidocaine with epinephrine. The incision was made with the 15 blade and then the Bovie was used to divide the platysma, taking care to stay posterior to the marginal mandibular nerve and to avoid injury to the marginal mandibular nerve. The right marginal mandibular nerve was intact throughout the case. I then used a blunt hemostat to dissect through the deep neck tissue until the perimandibular abscess was entered. I then widely opened the abscess pocket anterior and posterior to the mandibular ramus/body/angle, and drained about 50-186mL of grossly purulent fluid, which I cultured and then suctioned out. I irrigated the abscess pocket out with Bacitracin saline and then placed two penrose drains, one anterior to and one posterior to the mandible in the abscess pocket, and I secured these to the skin with 2-0 prolene. I then loosely closed  the incision around the Penrose drains with 3-0 chromic gut. I also opened the gingiva lateral to the right mandibular third molar using a blunt hemostat and drained 2-3 mL of dishwater type fluid.  The patient was turned back to anesthesia and awakened from anesthesia and extubated without difficulty. The patient tolerated the procedure well with no immediate complications and was taken to the postoperative recovery area in good condition.   Dr. Melvenia Beam was present and performed the entire procedure. 08/21/2012 7:25 PM Melvenia Beam

## 2012-08-21 NOTE — Preoperative (Signed)
Beta Blockers   Reason not to administer Beta Blockers:Not Applicable, pt took 12/12 @1000 

## 2012-08-21 NOTE — Progress Notes (Signed)
Family Medicine Teaching Service Daily Progress Note Service Page: [phone]  Patient Assessment: Frank [name] is a 61 y.o. year old male w/ h/o HTN and previous treatment for dental infection presenting with impressive dental abscess.   Subjective: Pt reports pain is well controlled this morning.  Objective: Temp:  [97.8 F (36.6 C)-98.8 F (37.1 C)] 98.8 F (37.1 C) (12/12 0537) Pulse Rate:  [93-116] 100  (12/12 0537) Resp:  [18-20] 18  (12/12 0537) BP: (112-140)/(70-96) 137/90 mmHg (12/12 0537) SpO2:  [95 %-99 %] 97 % (12/12 0537) Weight:  [146 lb 14.4 oz (66.633 kg)] 146 lb 14.4 oz (66.633 kg) (12/11 2146) Exam: General: NAD, pleasant HEENT: large swelling noted on lateral aspect of right lower face Cardiovascular: RRR Respiratory: normal respiratory effort Neuro: speech intact  I have reviewed the patient's medications, labs, imaging, and diagnostic testing.  Notable results are summarized below.  Medications:  Scheduled Meds: Clindamycin 600mg  IV q6h Diltiazem 240mg  PO daily Metoprolol-XL 100mg  PO daily Senna BID  PRN Meds: Morphine 2-4mg  PO q2h prn Zofran 4mg   Tylenol 650mg  q6h prn Miralax 17g daily prn  IVF: NS with KCl @ 125 cc/hr  Labs:  CBC  Lab 08/20/12 1314  WBC 13.3*  HGB 12.3*  HCT 38.8*  PLT 316    BMET  Lab 08/20/12 2203 08/20/12 1314  NA 136 138  K 3.4* 3.4*  CL 98 96  CO2 28 31  BUN 11 12  CREATININE 0.67 0.69  GLUCOSE 102* 140*  CALCIUM 9.0 9.6   PT 15.1 INR 1.21  Imaging/Diagnostic Tests:  CT Maxillofacial 12/11: Large abscess in the masticator space on the right. Based on the location, this is most likely dental in origin however no definite peri apical abscess is present. Dental caries are present in the right lower third molar. The oropharynx is displaced to the left but there is no compromise of the airway.    Assessment/Plan: Frank [name] is a 61 y.o. year old male w/ h/o HTN and previous  treatment for dental infection presenting with impressive dental abscess.   1. Abscess: 5.4x3.2cm masticator abscess w/ dental origin. Pt likely did not respond to ABX from 12/8 as abscess was so large and walled off. Pt is stable but will need surgical drainage by ENT in order to resolve.  - spoke with Dr. Emeline Darling of ENT this morning, who will see patient - Dr. Kristin Bruins of dentistry also following pt - NPO for likely surgery  - Coags and Type and Screen obtained yesterday - Morphine for pain  - Clindamycin IV q6h - blood Cx x2 obtained - f/u on these results  2. CV: Hemodynamically stable BP and HR stable. No evidence of septic shock at this time. EKG unremarkable  - Continue home Metoprolol and Diltiazem  3. FEN/GI: Pt nutritional status decreased over last few days and dehydrated at time of admission. Pt received 1L NS bolus in ED. Hypokalemic on labs  - IVF NS w/ KCL as pt dehydrated  - NPO at this time pending surgical eval  4. Prophylaxis: - SCD 's for VTE prophylaxis  5. Disposition:  - pending surgery and recovery. - Case mgt consulted as pt uninsured and will likely need Rx and financial assistance at time of DC   Levert Feinstein, MD [facility] Medicine PGY-1

## 2012-08-21 NOTE — Transfer of Care (Signed)
Immediate Anesthesia Transfer of Care Note  Patient: Frank [name]  Procedure(s) Performed: Procedure(s) (LRB) with comments: INCISION AND DRAINAGE ABSCESS (N/A)  Patient Location: PACU  Anesthesia Type:General  Level of Consciousness: awake and alert   Airway & Oxygen Therapy: Patient Spontanous Breathing and Patient connected to nasal cannula oxygen  Post-op Assessment: Report given to PACU RN and Post -op Vital signs reviewed and stable  Post vital signs: Reviewed and stable  Complications: No apparent anesthesia complications

## 2012-08-21 NOTE — H&P (Signed)
Reviewed and discussed with Dr Konrad Dolores.  Also discussed with my partner attending, Dr. Deirdre Priest who saw and examined the patient.  Agree with the assessment, plan, management and documentation.  Briefly, 61 yo male with significant facial abscess from dental source.  We are covering with antibiotics and working with ENT and oral surg to see which (or both) is best for the surgical intervention this man will need.

## 2012-08-21 NOTE — Anesthesia Procedure Notes (Addendum)
Performed by: Brien Mates DOBSON    Performed by: Brien Mates DOBSON    Procedure Name: Intubation Date/Time: 08/21/2012 6:14 PM Performed by: Brien Mates DOBSON Pre-anesthesia Checklist: Patient identified, Emergency Drugs available, Suction available, Patient being monitored and Timeout performed Patient Re-evaluated:Patient Re-evaluated prior to inductionOxygen Delivery Method: Circle system utilized Preoxygenation: Pre-oxygenation with 100% oxygen Intubation Type: IV induction Ventilation: Mask ventilation without difficulty Laryngoscope size: Elective Glidescope. Grade View: Grade I Tube type: Oral Number of attempts: 2 (attempt x 1 by CRNA, but unable to fit fiberoptic blade in  mouth.  Attempt x 1 by MDA with Glide with grade I view) Airway Equipment and Method: Video-laryngoscopy (Airway cart in room, ENT at [facility] with trach cart ready) Placement Confirmation: ETT inserted through vocal cords under direct vision,  positive ETCO2,  CO2 detector and breath sounds checked- equal and bilateral Secured at: 22 cm Tube secured with: Tape Dental Injury: Dental damage  Difficulty Due To: Difficulty was anticipated, Difficult Airway- due to dentition and Difficult Airway- due to limited oral opening Comments: Anticipated difficult airway secondary to large neck abscess.  Discussed in depth by both CRNA and MDA about possible dental damage.   After DL by MDA, it was noted that left front tooth was loose and right front tooth was slightly loose. Both teeth still intact in gum with no obvious cracks. Discussed plan of action with Gore.

## 2012-08-21 NOTE — Consult Note (Signed)
DENTAL CONSULTATION  Date of Consultation:  08/21/2012 Patient Name:   Frank [name] Date of Birth:   [birth year] Medical Record Number: [id number]  VITALS: BP 137/90  Pulse 100  Temp 98.8 F (37.1 C) (Oral)  Resp 18  Ht 5\' 6"  (1.676 m)  Wt 146 lb 14.4 oz (66.633 kg)  BMI 23.71 kg/m2  SpO2 97%   CHIEF COMPLAINT: Patient referred for evaluation of right neck swelling.  HPI: Frank [name] is a 61 year old male with history of right facial and neck swelling for the past 2 weeks. Patient presented to the emergency room on 08/17/2012 and was prescribed penicillin and Vicodin at that time. The swelling progressed with increased trismus and subsequently presented to the emergency department and was admitted. Patient was then placed on IV clindamycin antibiotic therapy. Dental consultation requested to evaluate right facial and neck swelling.  Patient points to the lower right molar as the tooth that may be causing his toothache. Patient indicates that he thinks that it is the" wisdom tooth" .  Patient fractured off a portion of the tooth "a while back" and subsequently developed the acute symptoms that he is presenting with.  Patient has not seen a dentist since 2006 when he had an exam and cleaning by a dentist in [place]. The dentist has since retired. Patient currently has no primary dentist and does not seek regular dental care.  PMH: Past Medical History  Diagnosis Date  . Hypertension   . Dental abscess 08/20/2012    "right lower jaw" (08/20/2012)    PSH: Past Surgical History  Procedure Date  . No past surgeries     ALLERGIES: No Known Allergies  MEDICATIONS: Current Facility-Administered Medications  Medication Dose Route Frequency Provider Last Rate Last Dose  . 0.9 % NaCl with KCl 40 mEq / L  infusion   Intravenous Continuous Ozella Rocks, MD 125 mL/hr at 08/21/12 0755    . acetaminophen (TYLENOL) tablet 650 mg  650 mg Oral Q6H PRN Ozella Rocks, MD       Or  . acetaminophen (TYLENOL) suppository 650 mg  650 mg Rectal Q6H PRN Ozella Rocks, MD      . clindamycin (CLEOCIN) IVPB 600 mg  600 mg Intravenous Q6H Ozella Rocks, MD   600 mg at 08/21/12 0603  . [COMPLETED] clindamycin (CLEOCIN) IVPB 900 mg  900 mg Intravenous Once Arthor Captain, PA-C   900 mg at 08/20/12 1914  . diltiazem (TIAZAC) 24 hr capsule 240 mg  240 mg Oral Daily Ozella Rocks, MD      . [address] HYDROmorphone (DILAUDID) injection 1 mg  1 mg Intravenous Once Arthor Captain, PA-C   1 mg at 08/20/12 1642  . influenza  inactive virus vaccine (FLUZONE/FLUARIX) injection 0.5 mL  0.5 mL Intramuscular Tomorrow-1000 Sanjuana Letters, MD      . [COMPLETED] iohexol (OMNIPAQUE) 300 MG/ML solution 80 mL  80 mL Intravenous Once PRN Medication Radiologist, MD   80 mL at 08/20/12 1827  . metoprolol succinate (TOPROL-XL) 24 hr tablet 100 mg  100 mg Oral Daily Ozella Rocks, MD      . morphine 2 MG/ML injection 2-4 mg  2-4 mg Intravenous Q2H PRN Ozella Rocks, MD   4 mg at 08/21/12 6295  . ondansetron (ZOFRAN) tablet 4 mg  4 mg Oral Q6H PRN Ozella Rocks, MD       Or  . ondansetron [facility]) injection 4 mg  4  mg Intravenous Q6H PRN Ozella Rocks, MD      . [COMPLETED] ondansetron (ZOFRAN-ODT) disintegrating tablet 4 mg  4 mg Oral Once Arthor Captain, PA-C   4 mg at 08/20/12 1641  . polyethylene glycol (MIRALAX / GLYCOLAX) packet 17 g  17 g Oral Daily PRN Ozella Rocks, MD      . senna [facility]) tablet 8.6 mg  1 tablet Oral BID Ozella Rocks, MD   8.6 mg at 08/20/12 2244  . [COMPLETED] sodium chloride 0.9 % bolus 1,000 mL  1,000 mL Intravenous Once Arthor Captain, PA-C   1,000 mL at 08/20/12 1914    LABS: Lab Results  Component Value Date   WBC 13.3* 08/20/2012   HGB 12.3* 08/20/2012   HCT 38.8* 08/20/2012   MCV 85.8 08/20/2012   PLT 316 08/20/2012      Component Value Date/Time   NA 136 08/20/2012 2203   K 3.4* 08/20/2012 2203   CL 98  08/20/2012 2203   CO2 28 08/20/2012 2203   GLUCOSE 102* 08/20/2012 2203   BUN 11 08/20/2012 2203   CREATININE 0.67 08/20/2012 2203   CALCIUM 9.0 08/20/2012 2203   GFRNONAA >90 08/20/2012 2203   GFRAA >90 08/20/2012 2203   Lab Results  Component Value Date   INR 1.21 08/20/2012   No results found for this basename: PTT    SOCIAL HISTORY: History   Social History  . Marital Status: Single    Spouse Name: N/A    Number of Children: N/A  . Years of Education: N/A   Occupational History  . Not on file.   Social History Main Topics  . Smoking status: Never Smoker   . Smokeless tobacco: Never Used  . Alcohol Use: No  . Drug Use: No  . Sexually Active: No   Other Topics Concern  . Not on file   Social History Narrative  . No narrative on file    FAMILY HISTORY: History reviewed. No pertinent family history.   REVIEW OF SYSTEMS: Reviewed from chart for this admission.  DENTAL HISTORY: CHIEF COMPLAINT: Patient referred for evaluation of right neck swelling.  HPI: Frank [name] is a 61 year old male with history of right facial and neck swelling for the past 2 weeks. Patient presented to the emergency room on 08/17/2012 and was prescribed penicillin and Vicodin at that time. The swelling progressed with increased trismus and subsequently presented to the emergency department and was admitted. Patient was then placed on IV clindamycin antibiotic therapy. Dental consultation requested to evaluate right facial and neck swelling.  Patient points to the lower right molar as the tooth that may be causing his toothache. Patient indicates that he thinks that it is the" wisdom tooth" .  Patient fractured off a portion of the tooth "a while back" and subsequently developed the acute symptoms that he is presenting with.  Patient has not seen a dentist since 2006 when he had an exam and cleaning by a dentist in [place]. The dentist has since retired. Patient currently  has no primary dentist and does not seek regular dental care.  DENTAL EXAMINATION:  GENERAL: Patient is a well-developed, well-nourished male in no acute distress. HEAD AND NECK: Patient with significant right neck swelling. No obvious left neck swelling is noted. Patient with significant trismus and decreased maximum interincisal opening of approximately 5 mm. INTRAORAL EXAM: Significant right neck swelling with extension into the right buccal vestibule.  Carious portion of tooth #32 was visualized  and tooth is erupted. It is difficult to perform clinical examination due to the significant trismus and swelling of the right neck. DENTITION: The patient appears to be missing tooth numbers 1 and 16. PERIODONTAL: The patient has chronic periodontitis with significant plaque and calculus accumulations, generalized gingival recession and moderate bone loss. DENTAL CARIES/SUBOPTIMAL RESTORATIONS: Extensive dental caries are associated with the mesial of tooth #32 with probable pulpal extension. ENDODONTIC: History of acute pulpitis symptoms coming from tooth #32 by patient report. Periapical pathology is noted at the apices of tooth #32. CROWN AND BRIDGE: No crown restorations are noted. PROSTHODONTIC: Patient denies presence of partial dentures. OCCLUSION: Patient with a poor occlusal scheme but stable occlusion at this time.  RADIOGRAPHIC INTERPRETATION: A suboptimal panoramic x-ray was obtained on 08/21/2012. Patient is missing tooth numbers 1 and 16. Radiographic calculus is noted. Periapical radiolucency is associated with tooth #32. Dental caries are associated with the mesial of tooth #32. Moderate to severe bone loss is noted.   ASSESSMENTS: 1. Right neck and facial swelling is noted. CT scan indicates that significant abscess is present.  I suggest ENT referral for definitive evaluation for incision and drainage procedure at this time. 2. Extensive dental caries tooth #32 3. History of  acute pulpitis symptoms #32 4. Acute Apical periodontitis of tooth #32 5. 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 Poor occlusal scheme but a stable occlusion 8. History of oral neglect   PLAN/RECOMMENDATIONS: 1. I discussed the risks, benefits, and complications of various treatment options with the patient in relationship to his medical and dental conditions. Patient currently need evaluation by ENT for evaluation for incision and drainage procedure of the right neck abscess per CT scan. Family medicine resident has been contacted and will arrange for ENT followup as indicated.  Patient will then be followed and referred to an oral surgeon for extraction of tooth #32 as an outpatient once infection resolves.  Patient will also need to seek regular dental care up by a new primary dentist with exam, dental x-rays, and overall discussion of treatment needs.   2. Discussion of findings with medical team and coordination of future medical and dental care.   Charlynne Pander, DDS

## 2012-08-21 NOTE — Consult Note (Signed)
08/21/2012 12:22 PM  Frank [name]  PREOPERATIVE HISTORY AND PHYSICAL  CHIEF COMPLAINT/REASON for CONSULT: right odontogenic facial/masticator space abscess  HISTORY: This is a 61 year old who presented to [place] on 08/17/12 with dental pain and facial swelling. He was diagnosed with a dental abscess but was not imaged and sent home with penicillin. He worsened clinically and re-presented to [facility] ER on 08/20/12 and was admitted to family medicine. ENT was not called at that time. Oral surgery is not comfortable draining the abscess so ENT was consulted today.  He now presents for transoral/trancervical incision and drainage of the right third molar odontogenic abscess.  Dr. Emeline Darling, [place] has discussed the risks (bleeding, infection, facial nerve injury/facial weakness), benefits, and alternatives of this procedure. The patient understands the risks and would like to proceed with the procedure. The chances of success of the procedure are >50% and the patient understands this. I personally performed an examination of the patient within 24 hours of the procedure. The patient will still need removal of the right mandibular third molar at some point as this is the likely nidus of infection.  PAST MEDICAL HISTORY: Past Medical History  Diagnosis Date  . Hypertension   . Dental abscess 08/20/2012    "right lower jaw" (08/20/2012)    PAST SURGICAL HISTORY: Past Surgical History  Procedure Date  . No past surgeries     MEDICATIONS: No current facility-administered medications on file prior to encounter.   Current Outpatient Prescriptions on File Prior to Encounter  Medication Sig Dispense Refill  . diltiazem (TIAZAC) 240 MG 24 hr capsule Take 240 mg by mouth daily.      . hydrochlorothiazide (HYDRODIURIL) 25 MG tablet Take 25 mg by mouth daily.      Marland Kitchen HYDROcodone-acetaminophen (NORCO/VICODIN) 5-325 MG per tablet Take 2 tablets by mouth every 4 (four) hours as needed for pain.  10  tablet  0  . metoprolol succinate (TOPROL-XL) 100 MG 24 hr tablet Take 100 mg by mouth daily. Take with or immediately following a meal.        ALLERGIES: No Known Allergies   SOCIAL HISTORY: History   Social History  . Marital Status: Single    Spouse Name: N/A    Number of Children: N/A  . Years of Education: N/A   Occupational History  . Not on file.   Social History Main Topics  . Smoking status: Never Smoker   . Smokeless tobacco: Never Used  . Alcohol Use: No  . Drug Use: No  . Sexually Active: No   Other Topics Concern  . Not on file   Social History Narrative  . No narrative on file    FAMILY HISTORY:family history is not on file.  REVIEW OF SYSTEMS:  HEENT:right facial swelling, trismus, and jaw pain, otherwise negative x 10 systems except per HPI   PHYSICAL EXAM:  GENERAL:  NAD VITAL SIGNS:   Filed Vitals:   08/21/12 1002  BP: 128/88  Pulse: 91  Temp: 98.6 F (37 C)  Resp: 18  SKIN: warm, dry  HEENT/NECK: :  Trismus to 3cm, poor dentition. Right firm, tense, fluctuant facial and upper neck swelling with overlying erythema consistent with facial/dental abscess   LYMPH:  Scattered right sided nodes LUNGS:  grossly clear CARDIOVASCULAR:  RRR ABDOMEN:  Soft , NT MUSCULOSKELETAL: normal strength PSYCH:  Normal affect NEUROLOGIC:  CN 2-12 intact and symmetric   DIAGNOSTIC STUDIES: CT neck with contrast shows right third molar lucency and adjacent  air with large 5x3 cm right massteric/masticator space/perimandibular abscess.  ASSESSMENT AND PLAN: Plan to proceed with incision and drainage of right facial/dental abscess via transoral and transcervical route. Will need to see dental about the third molar at some point. Patient understands the risks, benefits, and alternatives. Consent is signed, witness on chart, NPO. 08/21/2012 12:22 PM Frank [name]

## 2012-08-21 NOTE — Progress Notes (Signed)
INITIAL NUTRITION ASSESSMENT  DOCUMENTATION CODES Per approved criteria  -Severe malnutrition in the context of acute illness or injury   INTERVENTION: 1. Supplement diet as able once diet advanced to help replete nutrient stores.  2. MD: Monitor magnesium, potassium, and phosphorus daily for at least 3 days, MD to replete as needed, as pt is at risk for refeeding syndrome given dx of severe malnutrition. 3. RD to continue to follow nutrition care plan  NUTRITION DIAGNOSIS: Inadequate oral intake related to mandibular abscess as evidenced by dietary recall and significant wt loss.   Goal: Pt to meet >/= 90% of their estimated nutrition needs.  Monitor:  weight trends, lab trends, I/O's, PO intake, supplement tolerance  Reason for Assessment: Malnutrition Screening  61 y.o. male  Admitting Dx: mandibular abscess  ASSESSMENT: Admitted with 2-week R cheek pain and swelling. Recent limited movement of jaw and decreased PO intake. Pt confirms that intake has been poor, states that he was able to eat small amounts daily but last full meal was at least 2 weeks ago. Also confirms weight loss, notes that his usual weight is 158 lb, and is currently down to 146 lb. This is a weight change of 8% x 2 weeks per his report and is significant. Remains NPO at this time. Hypokalemic upon admission 2/2 poor intake. No recent phosphorus or magnesium available.  Pt meets criteria for severe MALNUTRITION in the context of acute illness/injury as evidenced by 8% wt loss x 2 weeks and intake of <50% estimated energy intake x at least 5 days.   Height: Ht Readings from Last 1 Encounters:  08/20/12 5\' 6"  (1.676 m)   Weight: Wt Readings from Last 1 Encounters:  08/20/12 146 lb 14.4 oz (66.633 kg)   Ideal Body Weight: 142 lb/64.5 kg  % Ideal Body Weight: 103%  Wt Readings from Last 10 Encounters:  08/20/12 146 lb 14.4 oz (66.633 kg)   Usual Body Weight: 158 lb per pt  % Usual Body Weight:  92%  BMI:  Body mass index is 23.71 kg/(m^2). Weight is WNL  Estimated Nutritional Needs: Kcal: 1650 - 1850 kcal Protein: 70 - 85 grams Fluid: 1.6 - 1.8 liters daily  Skin: intact  Diet Order: NPO  EDUCATION NEEDS: -No education needs identified at this time   Intake/Output Summary (Last 24 hours) at 08/21/12 1015 Last data filed at 08/21/12 4098  Gross per 24 hour  Intake      0 ml  Output      0 ml  Net      0 ml   Last BM: 12/10  Labs:   Lab 08/20/12 2203 08/20/12 1314  NA 136 138  K 3.4* 3.4*  CL 98 96  CO2 28 31  BUN 11 12  CREATININE 0.67 0.69  CALCIUM 9.0 9.6  MG -- --  PHOS -- --  GLUCOSE 102* 140*   No results found for this basename: phos   No results found for this basename: mg    CBG (last 3)  No results found for this basename: GLUCAP:3 in the last 72 hours  Scheduled Meds:   . clindamycin (CLEOCIN) IV  600 mg Intravenous Q6H  . [COMPLETED] clindamycin (CLEOCIN) IV  900 mg Intravenous Once  . diltiazem  240 mg Oral Daily  . [COMPLETED]  HYDROmorphone (DILAUDID) injection  1 mg Intravenous Once  . influenza  inactive virus vaccine  0.5 mL Intramuscular Tomorrow-1000  . metoprolol succinate  100 mg Oral Daily  . [  COMPLETED] ondansetron  4 mg Oral Once  . senna  1 tablet Oral BID  . [COMPLETED] sodium chloride  1,000 mL Intravenous Once   Continuous Infusions:   . 0.9 % NaCl with KCl 40 mEq / L 125 mL/hr at 08/21/12 1610    Past Medical History  Diagnosis Date  . Hypertension   . Dental abscess 08/20/2012    "right lower jaw" (08/20/2012)    Past Surgical History  Procedure Date  . No past surgeries    Jarold Motto MS, RD, LDN Pager: [phone] After-hours pager: [phone]

## 2012-08-21 NOTE — ED Provider Notes (Signed)
Medical screening examination/treatment/procedure(s) were conducted as a shared visit with non-physician practitioner(s) and myself.  I personally evaluated the patient during the encounter.  61 year old male with worsening right-sided facial and neck pain. Recent evaluated and prescribed penicillin pain medication. Has been taking with worsening of symptoms. On exam patient with a fluctuant a right sided neck mass on the right lateral neck and body of the right mandible. Significant trismus. Normal phonation. Handling secretions. No respiratory distress. CT shows a large abscess. Neurosurgery consult. IV antibiotics. Pain medication. Admission.  Dg Orthopantogram  08/21/2012  *RADIOLOGY REPORT*  Clinical Data: Right-sided abscess in the masticator space.  Right lower tooth pain and swelling.  [facility]  Comparison: Maxillofacial CT, 08/20/2012  Findings: There is a large carrie in the number 32 tooth, the third mandibular molar on the right.  There is also mild periapical lucency around the base of this tube supporting a dental infection as the origin of the soft tissue, right masticator space infection.  There are no other areas of periapical lucency.  No mandibular masses or lesions are seen.  There is no bone resorption along the right mandible to suggest osteomyelitis.  IMPRESSION: Mild peri apical lucency along the base of the number 32 tooth, the right third mandibular molar.  This same to shows a large carrie. This is likely the source of the patient's masticator space abscess.   Original Report Authenticated By: Amie Portland, M.D.    Ct Maxillofacial W/cm  08/20/2012  *RADIOLOGY REPORT*  Clinical Data: Right dental abscess with neck extension.  CT MAXILLOFACIAL WITH CONTRAST  Technique:  Multidetector CT imaging of the maxillofacial structures was performed with intravenous contrast. Multiplanar CT image reconstructions were also generated.  Contrast: 80mL OMNIPAQUE IOHEXOL 300  MG/ML  SOLN  Comparison: None.  Findings: Large fluid collection in the right masticator space compatible with an abscess.  This has central fluid density and enhancement of the wall with irregular margins.  This extends into the masseter  muscle and into the pterygoid muscles.  The mass extends posterior to the angle of the mandible were it measures approximately 5.4 x 3.2 cm.  There is additional abscess extending medial to the mandible.  There is mass effect and displacement of the oropharynx to the left.  No compromise of the airway.  The mass is adjacent to and  displaces the right submandibular gland.  The floor of the mouth appears intact.  The paranasal sinuses are clear.  There is lucency in the right lower third molar compatible with dental infection.  No definite peri apical abscess is present. Nevertheless, based on the location of the abscess, it is most likely dental in origin.  No recent extraction is identified.  IMPRESSION: Large abscess in the masticator space on the right.  Based on the location, this is most likely dental in origin however no definite peri apical abscess is present.  Dental caries are present in the right lower third molar.  The oropharynx is displaced to the left but there is no compromise of the airway.   Original Report Authenticated By: Janeece Riggers, M.D.    Raeford Razor, MD 08/21/12 [phone]

## 2012-08-22 ENCOUNTER — Encounter (HOSPITAL_COMMUNITY): Payer: Self-pay | Admitting: Otolaryngology

## 2012-08-22 LAB — BASIC METABOLIC PANEL
Calcium: 8.9 mg/dL (ref 8.4–10.5)
Chloride: 95 mEq/L — ABNORMAL LOW (ref 96–112)
Creatinine, Ser: 0.6 mg/dL (ref 0.50–1.35)
GFR calc Af Amer: >90 mL/min (ref >90)
GFR calc non Af Amer: >90 mL/min (ref >90)

## 2012-08-22 LAB — CBC
Platelets: 339 10*3/uL (ref 150–400)
RDW: 13.1 % (ref 11.5–15.5)
WBC: 14.3 10*3/uL — ABNORMAL HIGH (ref 4.0–10.5)

## 2012-08-22 NOTE — Progress Notes (Signed)
Family Medicine Teaching Service Attending Note  On 12-12 I interviewed and examined patient Frank [name] and reviewed their tests and x-rays.  I discussed with Dr. Pollie Meyer and reviewed their note for today.  I agree with their assessment and plan.

## 2012-08-22 NOTE — Progress Notes (Signed)
Family Medicine Teaching Service Daily Progress Note Service Page: [phone]  Patient Assessment: Frank [name] is a 61 y.o. year old male w/ h/o HTN and previous treatment for dental infection presenting with impressive dental abscess.   Subjective: Pt is doing well this morning. He says his pain is well controlled. He thinks his swelling has come down some.  Objective: Temp:  [97.2 F (36.2 C)-98.6 F (37 C)] 97.5 F (36.4 C) (12/13 0619) Pulse Rate:  [83-95] 86  (12/13 0619) Resp:  [16-20] 18  (12/13 0619) BP: (114-143)/(82-89) 114/83 mmHg (12/13 0619) SpO2:  [96 %-100 %] 97 % (12/13 0619) Exam: General: NAD, pleasant HEENT: large swelling on lateral aspect of right lower face, however swelling is improved from yesterday. Drains in place. Able to open mouth about halfway. Cardiovascular: RRR Respiratory: normal respiratory effort Abd: nontender to palpation Ext: lower extremities nontender to palpation Neuro: speech intact  I have reviewed the patient's medications, labs, imaging, and diagnostic testing.  Notable results are summarized below.  Medications:  Scheduled Meds: Clindamycin 600mg  IV q6h Doxycycline 100mg  IV BID Chlorhexidine mouthwash BID Decadron 10mg  IV TID Diltiazem 240mg  PO daily Metoprolol-XL 100mg  PO daily Senna BID  PRN Meds: Morphine 2-4mg  PO q2h prn Zofran 4mg   Tylenol 650mg  q6h prn Miralax 17g daily prn  IVF: NS with KCl @ 125 cc/hr  Labs:  Abscess culture - pending  Imaging/Diagnostic Tests:  CT Maxillofacial 12/11: Large abscess in the masticator space on the right. Based on the location, this is most likely dental in origin however no definite peri apical abscess is present. Dental caries are present in the right lower third molar. The oropharynx is displaced to the left but there is no compromise of the airway.    Assessment/Plan: Frank [name] is a 61 y.o. year old male w/ h/o HTN and previous treatment  for dental infection presenting with impressive dental abscess.   1. Abscess: 5.4x3.2cm masticator abscess w/ dental origin. Pt likely did not respond to ABX from 12/8 as abscess was so large and walled off.  - Dr. Emeline [name] of ENT took pt to OR yesterday afternoon for drainage of abscess - Dr. Kristin [name] of dentistry also following pt - Morphine for pain  - Clindamycin IV q6h & Doxycycline 100mg  BID IV (doxy added 12/12 by ENT) - chlorhexidine mouthwash BID - Decadron IV TID - blood Cx x2 obtained - f/u on these results  2. CV: Hemodynamically stable BP and HR stable. No evidence of septic shock at this time. EKG unremarkable  - Continue home Metoprolol and Diltiazem  3. FEN/GI: Pt nutritional status decreased over last few days and dehydrated at time of admission. Pt received 1L NS bolus in ED. Hypokalemic on labs  - IVF NS w/ KCL as pt dehydrated  - clear liquid diet  4. Prophylaxis: - SCD 's for VTE prophylaxis  5. Disposition:  - pending clinical improvement, as well as transition to PO pain medications and antibiotics - Case mgt consulted as pt uninsured and will likely need Rx and financial assistance at time of DC   Frank Feinstein, MD [facility] Medicine PGY-1

## 2012-08-22 NOTE — Progress Notes (Signed)
Family Medicine Teaching Service Attending Note  I interviewed and examined patient Frank [name] and reviewed their tests and x-rays.  I discussed with Dr. Pollie Meyer and reviewed their note for today.  I agree with their assessment and plan.     Additionally  Feels better No systemic signs Start Miralax if no bowel movement today KVO iv

## 2012-08-22 NOTE — Progress Notes (Signed)
Subjective: POD#1 from incision and drainage of right masticator space odontogenic abscess. Patient feels better today, still with trismus and right facial edema/induration  Objective: Vital signs in last 24 hours: Temp:  [97.2 F (36.2 C)-98.6 F (37 C)] 97.5 F (36.4 C) (12/13 0619) Pulse Rate:  [83-95] 86  (12/13 0619) Resp:  [16-20] 18  (12/13 0619) BP: (114-143)/(82-89) 114/83 mmHg (12/13 0619) SpO2:  [96 %-100 %] 97 % (12/13 0619)  CN VII is House-brackmann 1/6 bilaterally in all divisions. Right neck incision is clean dry and intact with penrose drains in place x 2 at the posterior aspect of the incision. I expressed some dishwater-consistency fluid from the abscess cavity but the frank purulence appears improved. The right face and masseter are still quite firm and indurated/edematous and he still has trismus to ~3cm. Cn 2-12 intact and symmetric, neck is otherwise soft and trachea is midline.  @LABLAST2 (wbc:2,hgb:2,hct:2,plt:2)  Basename 08/20/12 2203 08/20/12 1314  NA 136 138  K 3.4* 3.4*  CL 98 96  CO2 28 31  GLUCOSE 102* 140*  BUN 11 12  CREATININE 0.67 0.69  CALCIUM 9.0 9.6    Medications: Scheduled Meds:   . chlorhexidine  15 mL Mouth/Throat BID  . clindamycin (CLEOCIN) IV  600 mg Intravenous Q6H  . dexamethasone  10 mg Intravenous Q8H  . diltiazem  240 mg Oral Daily  . doxycycline (VIBRAMYCIN) IV  100 mg Intravenous Q12H  . influenza  inactive virus vaccine  0.5 mL Intramuscular Tomorrow-1000  . metoprolol succinate  100 mg Oral Daily  . senna  1 tablet Oral BID   Continuous Infusions:   . 0.9 % NaCl with KCl 40 mEq / L 125 mL/hr at 08/22/12 0758  . lactated ringers 50 mL/hr at 08/21/12 1751   PRN Meds:.acetaminophen, acetaminophen, HYDROcodone-acetaminophen, morphine injection, ondansetron (ZOFRAN) IV, ondansetron, polyethylene glycol  Assessment/Plan: Doing well POD#1 from incision and drainage of right masticator space/odontogenic abscess. Would  continue Doxy and clindamycin until cultures/sensitivities back then can transition to PO antibiotics for 3 weeks when clinically improved. Advance diet as tolerated. Will wait to remove the penrose drains until he is clinically more improved.   LOS: 2 days   Melvenia Beam 08/22/2012, 9:50 AM

## 2012-08-23 NOTE — Progress Notes (Signed)
Family Medicine Teaching Service Attending Note  I interviewed and examined patient Frank [name] and reviewed their tests and x-rays.  I discussed with Dr. Pollie Meyer and reviewed their note for today.  I agree with their assessment and plan.     Additionally  Proceed as per surgery No systemic signs of infection or any airway compromise

## 2012-08-23 NOTE — Progress Notes (Signed)
08/23/2012 12:08 PM  Leanord Asal [id number]  Post-Op Day 2    Temp:  [97.9 F (36.6 C)-98.6 F (37 C)] 97.9 F (36.6 C) (12/14 0539) Pulse Rate:  [76-83] 83  (12/14 0539) Resp:  [16-18] 18  (12/14 0539) BP: (98-112)/(60-76) 108/76 mmHg (12/14 0539) SpO2:  [96 %-100 %] 98 % (12/14 0539),     Intake/Output Summary (Last 24 hours) at 08/23/12 1208 Last data filed at 08/23/12 0612  Gross per 24 hour  Intake    500 ml  Output    625 ml  Net   -125 ml    No results found for this or any previous visit (from the past 24 hour(s)).  SUBJECTIVE:  Pain and swelling somewhat less.  Still lots of trismus.  Taking solid diet reasonably well.  OBJECTIVE:  Drains in good position.  Min. Drainage.  Mod facial swelling and trismus.  IMPRESSION:  Satisfactory check.  PLAN:  Will D/C Doxycycline if cultures do not come back with MRSA.  Anticipate drains out Monday per Dr. Emeline Darling.  Flo Shanks

## 2012-08-23 NOTE — Progress Notes (Signed)
Family Medicine Teaching Service Daily Progress Note Service Page: [phone]  Patient Assessment: Frank [name] is a 61 y.o. year old male w/ h/o HTN and previous treatment for dental infection presenting with impressive dental abscess.   Subjective: Pt reports pain is well controlled. Had a bowel movement yesterday evening. Still has drains in place.  Objective: Temp:  [97.9 F (36.6 C)-98.6 F (37 C)] 97.9 F (36.6 C) (12/14 0539) Pulse Rate:  [76-83] 83  (12/14 0539) Resp:  [16-18] 18  (12/14 0539) BP: (98-112)/(60-76) 108/76 mmHg (12/14 0539) SpO2:  [96 %-100 %] 98 % (12/14 0539) Exam: General: NAD, pleasant, cooperative HEENT: large swelling on lateral aspect of right lower face, however swelling continues to improve. Area is wrapped, but per pt drains are still in place. Cardiovascular: RRR Respiratory: normal respiratory effort Abd: nontender to palpation Ext: lower extremities nontender to palpation Neuro: speech intact  I have reviewed the patient's medications, labs, imaging, and diagnostic testing.  Notable results are summarized below.  Medications:  Scheduled Meds: Clindamycin 600mg  IV q6h Doxycycline 100mg  IV BID Chlorhexidine mouthwash BID Diltiazem 240mg  PO daily Metoprolol-XL 100mg  PO daily Senna BID  PRN Meds: Morphine 2-4mg  PO q2h prn Hydrocodone-acet 7.5-500mg  liquid q4h prn pain Zofran 4mg   Tylenol 650mg  q6h prn Miralax 17g daily prn  IVF: KVO  Labs:  Abscess culture - NGTD Anaerobic cx - pending Blood cx x2 NGTD  Imaging/Diagnostic Tests:  CT Maxillofacial 12/11: Large abscess in the masticator space on the right. Based on the location, this is most likely dental in origin however no definite peri apical abscess is present. Dental caries are present in the right lower third molar. The oropharynx is displaced to the left but there is no compromise of the airway.   Assessment/Plan: Frank [name] is a 61 y.o. year old  male w/ h/o HTN and previous treatment for dental infection presenting with impressive dental abscess.   1. Abscess: 5.4x3.2cm masticator abscess w/ dental origin. Pt likely did not respond to ABX from 12/8 as abscess was so large and walled off.  - s/p abscess drainage on 12/12 ([facility]) - Dr. Kristin Bruins of dentistry also following pt - d/c morphine to transition to PO pain med (liquid hydrocodone-acetaminophen 7.5-500mg  q4h prn) as pain well controlled at this time - Clindamycin IV q6h & Doxycycline 100mg  BID IV (doxy added 12/12 by ENT) - Consider changing to PO abx today - chlorhexidine mouthwash BID - Decadron IV x3 doses completed - blood Cx x2 NGTD  2. CV: Hemodynamically stable BP and HR stable. No evidence of septic shock at this time. EKG unremarkable on admission - Continue home Metoprolol and Diltiazem  3. FEN/GI: Pt nutritional status decreased over last few days and dehydrated at time of admission. Pt received 1L NS bolus in ED. Hypokalemic on labs  - KVO IV - dysphagia 3 diet  4. Prophylaxis: - SCD 's for VTE prophylaxis  5. Disposition:  - pending clinical improvement, as well as transition to PO pain medications and antibiotics - Case mgt consulted as pt uninsured and will likely need Rx and financial assistance at time of DC   Levert Feinstein, MD [facility] Medicine PGY-1

## 2012-08-24 NOTE — Progress Notes (Signed)
Patient ID: Frank [name], male   DOB: 13-Nov-1950, 61 y.o.   MRN: [id number] [facility] Medicine Teaching Service PGY-1 Progress Note   Overnight Events: Patients pain is well controlled. He has been able to eat well. He has had daily BM. He will have his drains removed tomorrow per ENT.  Objective: Temp:  [97.4 F (36.3 C)-97.8 F (36.6 C)] 97.8 F (36.6 C) (12/15 0605) Pulse Rate:  [69-73] 69  (12/15 0605) Cardiac Rhythm:  [-]  Resp:  [18-20] 19  (12/15 0605) BP: (118-138)/(78-87) 138/87 mmHg (12/15 0605) SpO2:  [96 %-99 %] 96 % (12/15 0605) Weight change:   Physical Exam: Gen: NAD. Lying bed comfortably.  Head: Soft tissue swelling over Right mandibular arch. Site of drained abscess has kerlix dressing over. Which is clean, dry and intact. Drains are to be removed tomorrow.   CV: RRR. No murmur. Lungs: CTAB. No wheezing, rhonchi or rales.  Abd: Soft. NT.ND. No HSM EXT: NT. No erythema. No edema.    I have reviewed the patient's medications, labs, imaging, and diagnostic testing. Notable results are summarized below Medications:  Scheduled Meds:  Clindamycin 600mg  IV q6h  Doxycycline 100mg  IV BID  Chlorhexidine mouthwash BID  Diltiazem 240mg  PO daily  Metoprolol-XL 100mg  PO daily  Senna BID  PRN Meds:  Morphine 2-4mg  PO q2h prn  Hydrocodone-acet 7.5-500mg  liquid q4h prn pain  Zofran 4mg   Tylenol 650mg  q6h prn  Miralax 17g daily prn  IVF:  KVO  Abscess culture - NGTD  Anaerobic cx - pending  Blood cx x2 NGTD  Imaging/Diagnostic Tests:  CT Maxillofacial 12/11:  Large abscess in the masticator space on the right. Based on the location, this is most likely dental in origin however no definite peri apical abscess is present. Dental caries are present in the right lower third molar. The oropharynx is displaced to the left but there is no compromise of the airway.   Assessment and Plan: Frank [name] is a 61 y.o. year old male w/ h/o HTN and previous  treatment for dental infection presenting with impressive dental abscess.  1. Abscess: 5.4x3.2cm masticator abscess w/ dental origin. Pt likely did not respond to ABX from 12/8 as abscess was so large and walled off.  - s/p abscess drainage on 12/12 ([facility])  - Dr. Kristin Bruins of dentistry also following pt  -  PO pain med (liquid hydrocodone-acetaminophen 7.5-500mg  q4h prn) as pain well controlled at this time  - Clindamycin IV q6h & Doxycycline 100mg  BID IV (doxy added 12/12 by ENT), per ENT note, continue Doxy and clindamycin until cultures/sensitivities back then can transition to PO antibiotics for 3 weeks when clinically improved  - ABX management per ENT - chlorhexidine mouthwash BID  - Decadron IV x3 doses completed  - blood Cx x2 NGTD  2. CV: Hemodynamically stable BP and HR stable. No evidence of septic shock at this time. EKG unremarkable on admission  - Continue home Metoprolol and Diltiazem  3. FEN/GI: Pt nutritional status decreased over last few days and dehydrated at time of admission. Pt received 1L NS bolus in ED. Hypokalemic on labs  - KVO IV  - dysphagia 3 diet  4. Prophylaxis:  - SCD 's for VTE prophylaxis  5. Disposition:  - pending clinical improvement, as well as transition to PO pain medications and antibiotics  - Case mgt consulted as pt uninsured and will likely need Rx and financial assistance at time of DC

## 2012-08-24 NOTE — Progress Notes (Signed)
Family Medicine Teaching Service Attending Note  I interviewed and examined patient Frank [name] and reviewed their tests and x-rays.  I discussed with Dr. Claiborne Billings and reviewed their note for today.  I agree with their assessment and plan.     Additionally  Stable  Proceed as per ENT

## 2012-08-24 NOTE — Progress Notes (Signed)
08/24/2012 1:16 PM  Erasmo, Vertz [id number]      Temp:  [97.4 F (36.3 C)-97.8 F (36.6 C)] 97.8 F (36.6 C) (12/15 0605) Pulse Rate:  [69-73] 69  (12/15 0605) Resp:  [18-20] 19  (12/15 0605) BP: (118-138)/(78-87) 138/87 mmHg (12/15 0605) SpO2:  [96 %-99 %] 96 % (12/15 0605),     Intake/Output Summary (Last 24 hours) at 08/24/12 1316 Last data filed at 08/23/12 2341  Gross per 24 hour  Intake    760 ml  Output    901 ml  Net   -141 ml    No results found for this or any previous visit (from the past 24 hour(s)).  SUBJECTIVE:  Still trismus.  OBJECTIVE:  Mod bloody drainage.  Still facial swelling. Cultures still pending.  IMPRESSION:  Satisfactory check  PLAN:  Probably drains out tomorrow.  Likely needs tooth extracted, but this will be difficult to do until he can get his mouth open more widely.    Flo Shanks

## 2012-08-25 LAB — CULTURE, ROUTINE-ABSCESS

## 2012-08-25 MED ORDER — DEXAMETHASONE SODIUM PHOSPHATE 10 MG/ML IJ SOLN
10.0000 mg | Freq: Three times a day (TID) | INTRAMUSCULAR | Status: AC
  Administered 2012-08-25 (×3): 10 mg via INTRAVENOUS
  Filled 2012-08-25 (×3): qty 1

## 2012-08-25 MED ORDER — DOXYCYCLINE HYCLATE 100 MG PO TABS: 100.0000 mg | ORAL_TABLET | Freq: Two times a day (BID) | ORAL | Status: DC

## 2012-08-25 MED ORDER — DOXYCYCLINE HYCLATE 100 MG IV SOLR
100.0000 mg | Freq: Two times a day (BID) | INTRAVENOUS | Status: DC
  Administered 2012-08-25 – 2012-08-26 (×3): 100 mg via INTRAVENOUS
  Filled 2012-08-25 (×4): qty 100

## 2012-08-25 MED ORDER — CLINDAMYCIN PHOSPHATE 600 MG/50ML IV SOLN
600.0000 mg | Freq: Four times a day (QID) | INTRAVENOUS | Status: DC
  Administered 2012-08-25 – 2012-08-26 (×5): 600 mg via INTRAVENOUS
  Filled 2012-08-25 (×9): qty 50

## 2012-08-25 MED ORDER — CIPROFLOXACIN HCL 500 MG PO TABS: 500.0000 mg | ORAL_TABLET | Freq: Two times a day (BID) | ORAL | Status: DC

## 2012-08-25 NOTE — Progress Notes (Signed)
Family Medicine Teaching Service Daily Progress Note Service Page: [phone]  Patient Assessment: Frank [name] is a 61 y.o. year old male w/ h/o HTN and previous treatment for dental infection presenting with impressive dental abscess.   Subjective: Pt reports pain is well controlled. Drains were removed by ENT this morning. Pt has been able to eat well.  Objective: Temp:  [97.4 F (36.3 C)-98.4 F (36.9 C)] 97.4 F (36.3 C) (12/16 0610) Pulse Rate:  [70-73] 73  (12/16 0610) Resp:  [18-20] 20  (12/16 0610) BP: (107-127)/(74-93) 127/93 mmHg (12/16 0610) SpO2:  [97 %-99 %] 99 % (12/16 0610) Exam: General: NAD, pleasant, cooperative HEENT: swelling still present (but decreased) on lateral aspect of right lower face. Area is wrapped with some serosanguinous output on dressing - did not unwrap. Cardiovascular: RRR Respiratory: normal respiratory effort Abd: nontender to palpation Ext: lower extremities nontender to palpation Neuro: speech intact  I have reviewed the patient's medications, labs, imaging, and diagnostic testing.  Notable results are summarized below.  Medications:  Scheduled Meds: Clindamycin 600mg  IV q6h Doxycycline 100mg  IV BID Chlorhexidine mouthwash BID Decadron 10mg  q8h x3 doses today Diltiazem 240mg  PO daily Metoprolol-XL 100mg  PO daily Senna BID  PRN Meds: Hydrocodone-acet 7.5-500mg  liquid q4h prn pain Zofran 4mg   Tylenol 650mg  q6h prn Miralax 17g daily prn  IVF: KVO  Labs:  Abscess culture - reincubated for better growth Anaerobic cx - pending Blood cx x2 NGTD  Imaging/Diagnostic Tests:  CT Maxillofacial 12/11: Large abscess in the masticator space on the right. Based on the location, this is most likely dental in origin however no definite peri apical abscess is present. Dental caries are present in the right lower third molar. The oropharynx is displaced to the left but there is no compromise of the airway.    Assessment/Plan: Frank [name] is a 61 y.o. year old male w/ h/o HTN and previous treatment for dental infection presenting with impressive dental abscess.   1. Abscess: 5.4x3.2cm masticator abscess w/ dental origin. Pt likely did not respond to ABX from 12/8 as abscess was so large and walled off.  - s/p abscess drainage on 12/12 ([facility]) - Dr. Kristin Bruins of dentistry also aware fo pt - liquid hydrocodone-acetaminophen 7.5-500mg  q4h prn pain - Clindamycin IV q6h & Doxycycline 100mg  BID IV (doxy added 12/12 by ENT) - Per ENT, will continue these IV abx until sensitivities return on culture, then transition to PO abx 3 weeks - chlorhexidine mouthwash BID - Decadron IV x3 doses again today per ENT  2. CV: Hemodynamically stable BP and HR stable. No evidence of septic shock at this time. EKG unremarkable on admission - Continue home Metoprolol and Diltiazem  3. FEN/GI: Pt nutritional status decreased over last few days and dehydrated at time of admission. Pt received 1L NS bolus in ED. Hypokalemic on labs  - KVO IV - dysphagia 3 diet  4. Prophylaxis: - SCD 's for VTE prophylaxis  5. Disposition:  - pending clinical improvement, as well as transition to PO antibiotics - Case mgt consulted as pt uninsured and will likely need Rx and financial assistance at time of DC   Levert Feinstein, MD [facility] Medicine PGY-1

## 2012-08-25 NOTE — Progress Notes (Signed)
FMT[facility] Attending Daily Note:  Renold Don MD  [phone] pager  Family Practice pager:  [phone] I have seen and examined this patient and have reviewed their chart. I have discussed this patient with the resident. I agree with the resident's findings, assessment and care plan.  Appreciate recommendations by ENT.  Following cultures for transition to po antibiotics.  Agree with case manager consultation.

## 2012-08-25 NOTE — Progress Notes (Signed)
Subjective: POD#4 from transcervical incision and drainage of a right masticator space/odontogenic abscess. Swelling improved, did well over weekend, cultures still pending. Still with significant trismus.  Objective: Vital signs in last 24 hours: Temp:  [97.4 F (36.3 C)-98.4 F (36.9 C)] 97.4 F (36.3 C) (12/16 0610) Pulse Rate:  [70-73] 73  (12/16 0610) Resp:  [18-20] 20  (12/16 0610) BP: (107-127)/(74-93) 127/93 mmHg (12/16 0610) SpO2:  [97 %-99 %] 99 % (12/16 0610)  Right facial swelling is improved but still present. Still has significant trismus. No oral cavity bleeding and normal voice without stridor. Trachea midline. I palpated the right neck incision, just serosanguinous drainage, no gross purulence so I removed the two right neck penrose drains and placed 1/4 inch steristrips and changed his neck dressing. Patient tolerated drain removal well.  @LABLAST2 (wbc:2,hgb:2,hct:2,plt:2)  Basename 08/22/12 0958  NA 133*  K 4.2  CL 95*  CO2 25  GLUCOSE 245*  BUN 13  CREATININE 0.60  CALCIUM 8.9    Medications:  Scheduled Meds:   . chlorhexidine  15 mL Mouth/Throat BID  . clindamycin (CLEOCIN) IV  600 mg Intravenous Q6H  . dexamethasone  10 mg Intravenous Q8H  . diltiazem  240 mg Oral Daily  . doxycycline (VIBRAMYCIN) IV  100 mg Intravenous Q12H  . metoprolol succinate  100 mg Oral Daily  . senna  1 tablet Oral BID   Continuous Infusions:  PRN Meds:.acetaminophen, acetaminophen, HYDROcodone-acetaminophen, ondansetron (ZOFRAN) IV, ondansetron, polyethylene glycol  Assessment/Plan: Doing well POD#4 from right neck/masticator space odontogenic abscess I&D. I removed his penrose drains. Will give another 3 doses of Decadron for the persistent neck swelling/trismus, would continue the IV Doxycycline and Clindamycin until his cultures/sensitivities are back then transition to 3 weeks of PO antibiotics. Will need f/u with dental for the right mandibular third molar. Sutures are  absorbable.   LOS: 5 days   Melvenia Beam 08/25/2012, 7:23 AM

## 2012-08-25 NOTE — Progress Notes (Signed)
NUTRITION FOLLOW UP  Intervention:   1. Resource Breeze po daily, each supplement provides 250 kcal and 9 grams of protein. 2. RD to continue to follow nutrition care plan  Nutrition Dx:   Inadequate oral intake related to mandibular abscess as evidenced by dietary recall and significant wt loss.   Goal:   Pt to meet >/= 90% of their estimated nutrition needs. Likely met.  Monitor:   weight trends, lab trends, I/O's, PO intake, supplement tolerance  Assessment:   Underwent incision and drainage of right deep neck abscess on 12/12. Pt has been eating well, consuming 75 - 100% of meals. Advanced to Dysphagia 3 diet on 12/13. Pt would benefit from oral nutrition supplements to help with healing s/p surgery.  Height: Ht Readings from Last 1 Encounters:  08/20/12 5\' 6"  (1.676 m)    Weight Status:   Wt Readings from Last 1 Encounters:  08/20/12 146 lb 14.4 oz (66.633 kg)    Re-estimated needs:  Kcal: 1650 - 1850 kcal Protein: 70 - 85 grams protein Fluid: 1.6 - 1.8 liters daily  Skin: face incision  Diet Order: Dysphagia 3 with thin liquids   Intake/Output Summary (Last 24 hours) at 08/25/12 1413 Last data filed at 08/25/12 1357  Gross per 24 hour  Intake   1080 ml  Output    650 ml  Net    430 ml   Last BM: 12/16  Labs:   Lab 08/22/12 0958 08/20/12 2203 08/20/12 1314  NA 133* 136 138  K 4.2 3.4* 3.4*  CL 95* 98 96  CO2 25 28 31   BUN 13 11 12   CREATININE 0.60 0.67 0.69  CALCIUM 8.9 9.0 9.6  MG -- -- --  PHOS -- -- --  GLUCOSE 245* 102* 140*   CBG (last 3)  No results found for this basename: GLUCAP:3 in the last 72 hours  Scheduled Meds:   . chlorhexidine  15 mL Mouth/Throat BID  . clindamycin (CLEOCIN) IV  600 mg Intravenous Q6H  . dexamethasone  10 mg Intravenous Q8H  . diltiazem  240 mg Oral Daily  . doxycycline (VIBRAMYCIN) IV  100 mg Intravenous Q12H  . metoprolol succinate  100 mg Oral Daily  . senna  1 tablet Oral BID   Continuous Infusions:    Jarold Motto MS, RD, LDN Pager: [phone] After-hours pager: [phone]

## 2012-08-25 NOTE — ED Provider Notes (Signed)
Medical screening examination/treatment/procedure(s) were conducted as a shared visit with non-physician practitioner(s) and myself.  I personally evaluated the patient during the encounter.  Please see completed note for this encounter.  Raeford Razor, MD 08/25/12 [phone]

## 2012-08-26 LAB — ANAEROBIC CULTURE

## 2012-08-26 MED ORDER — CLINDAMYCIN HCL 300 MG PO CAPS
300.0000 mg | ORAL_CAPSULE | Freq: Four times a day (QID) | ORAL | Status: DC
  Administered 2012-08-26 – 2012-08-27 (×4): 300 mg via ORAL
  Filled 2012-08-26 (×8): qty 1

## 2012-08-26 MED ORDER — CLINDAMYCIN HCL 300 MG PO CAPS: 300.0000 mg | ORAL_CAPSULE | Freq: Three times a day (TID) | ORAL | Status: DC

## 2012-08-26 MED ORDER — DOXYCYCLINE HYCLATE 100 MG PO TABS
100.0000 mg | ORAL_TABLET | Freq: Two times a day (BID) | ORAL | Status: DC
  Administered 2012-08-26 – 2012-08-27 (×2): 100 mg via ORAL
  Filled 2012-08-26 (×4): qty 1

## 2012-08-26 NOTE — Progress Notes (Signed)
Family Medicine Teaching Service Daily Progress Note Service Page: [phone]  Patient Assessment: Frank [name] is a 61 y.o. year old male w/ h/o HTN and previous treatment for dental infection presenting with impressive dental abscess.   Subjective: Pt reports pain is well controlled. ENT to see patient later today.   Pt has been able to eat well and able to open his jaw slightly more than yesterday.    Objective: Temp:  [97.5 F (36.4 C)-97.9 F (36.6 C)] 97.9 F (36.6 C) (12/17 0458) Pulse Rate:  [76-79] 76  (12/17 0458) Resp:  [18-20] 20  (12/17 0458) BP: (106-137)/(71-94) 137/94 mmHg (12/17 0458) SpO2:  [97 %-98 %] 97 % (12/17 0458)  Exam: General: NAD, pleasant, cooperative HEENT: swelling still present (but decreased) on lateral aspect of right lower face.  Cardiovascular: RRR Respiratory: normal respiratory effort Ext: lower extremities nontender to palpation Neuro: speech intact  I have reviewed the patient's medications, labs, imaging, and diagnostic testing.  Notable results are summarized below.  Medications:  Scheduled Meds: Clindamycin 600mg  IV q6h Doxycycline 100mg  IV BID Chlorhexidine mouthwash BID Decadron 10mg  q8h x3 doses today Diltiazem 240mg  PO daily Metoprolol-XL 100mg  PO daily Senna BID  PRN Meds: Hydrocodone-acet 7.5-500mg  liquid q4h prn pain Zofran 4mg   Tylenol 650mg  q6h prn Miralax 17g daily prn  IVF: KVO  Labs:  Abscess culture - reincubated for better growth Anaerobic cx - pending Blood cx x2 NGTD  Imaging/Diagnostic Tests:  CT Maxillofacial 12/11: Large abscess in the masticator space on the right. Based on the location, this is most likely dental in origin however no definite peri apical abscess is present. Dental caries are present in the right lower third molar. The oropharynx is displaced to the left but there is no compromise of the airway.   Assessment/Plan: Frank [name] is a 61 y.o. year old male w/  h/o HTN and previous treatment for dental infection presenting with impressive dental abscess.   1. Abscess: 5.4x3.2cm masticator abscess w/ dental origin. Pt likely did not respond to ABX from 12/8 as abscess was so large and walled off.  - s/p abscess drainage on 12/12 ([facility]) - Dr. Kristin Bruins of dentistry also aware of pt - liquid hydrocodone-acetaminophen 7.5-500mg  q4h prn pain - Clindamycin IV q6h & Doxycycline 100mg  BID IV (doxy added 12/12 by ENT).  Will change to PO today and hopefully d/c later for 3 weeks PO of ABx pending sensitivity cultures.   2. CV: Hemodynamically stable BP and HR stable. No evidence of septic shock at this time. EKG unremarkable on admission - Continue home Metoprolol and Diltiazem  3. FEN/GI: Pt nutritional status decreased over last few days and dehydrated at time of admission. Pt received 1L NS bolus in ED. Hypokalemic on labs  - KVO IV - dysphagia 3 diet  4. Prophylaxis: - SCD 's for VTE prophylaxis  5. Disposition:  - pending clinical improvement, as well as transition to PO antibiotics - Case mgt consulted as pt uninsured and will likely need Rx and financial assistance at time of DC   [place] R. Paulina Fusi, DO of [facility] 08/26/2012, 9:42 AM

## 2012-08-26 NOTE — Progress Notes (Signed)
FMTS Attending Daily Note:  Renold Don MD  [phone] pager  Family Practice pager:  [phone] I have seen and examined this patient and have reviewed their chart. I have discussed this patient with the resident. I agree with the resident's findings, assessment and care plan.  Continuing to improve.  Transition to PO antibiotics today as tolerating orals.  Awaiting speciation to further narrow antibiotics if possible.  Will clarify with whom patient will follow-up (dentist versus oral surgeon) prior to discharge.

## 2012-08-27 ENCOUNTER — Telehealth (HOSPITAL_COMMUNITY): Payer: Self-pay | Admitting: Dental General Practice

## 2012-08-27 LAB — CULTURE, BLOOD (ROUTINE X 2)
Culture: NO GROWTH
Culture: NO GROWTH

## 2012-08-27 MED ORDER — DOXYCYCLINE HYCLATE 100 MG PO TABS: 100.0000 mg | ORAL_TABLET | Freq: Two times a day (BID) | ORAL | Status: DC

## 2012-08-27 MED ORDER — CLINDAMYCIN HCL 300 MG PO CAPS: 300.0000 mg | ORAL_CAPSULE | Freq: Four times a day (QID) | ORAL | Status: DC

## 2012-08-27 NOTE — Progress Notes (Signed)
FMTS Attending Daily Note:  Frank Don MD  [phone] pager  Family Practice pager:  [phone] I have discussed this patient with the resident Dr. Pollie Meyer. I agree with the resident's findings, assessment and care plan.

## 2012-08-27 NOTE — Progress Notes (Signed)
Subjective: POD#6 from incision and drainage right neck/masticator space abscess. Doing much better, trismus improved, neck swelling much better.  Objective: Vital signs in last 24 hours: Temp:  [97.7 F (36.5 C)-98.2 F (36.8 C)] 97.7 F (36.5 C) (12/18 0610) Pulse Rate:  [63-80] 80  (12/18 0959) Resp:  [18-20] 18  (12/18 0610) BP: (106-116)/(47-85) 110/47 mmHg (12/18 0959) SpO2:  [97 %-99 %] 98 % (12/18 0610)  Right neck incision is clean, dry, and intact with steristrips in place over the absorbable gut sutures. No purulence or cellulitis. CN 2-12 intact and symmetric. Trismus now to ~4 cm.  @LABLAST2 (wbc:2,hgb:2,hct:2,plt:2) No results found for this basename: NA:2,K:2,CL:2,CO2:2,GLUCOSE:2,BUN:2,CREATININE:2,CALCIUM:2 in the last 72 hours  Medications:  Scheduled Meds:   . chlorhexidine  15 mL Mouth/Throat BID  . clindamycin  300 mg Oral Q6H  . diltiazem  240 mg Oral Daily  . doxycycline  100 mg Oral Q12H  . metoprolol succinate  100 mg Oral Daily  . senna  1 tablet Oral BID   Continuous Infusions:  PRN Meds:.acetaminophen, acetaminophen, HYDROcodone-acetaminophen, ondansetron (ZOFRAN) IV, ondansetron, polyethylene glycol  Assessment/Plan: Doing well POD#6 from incision and drainage of right neck odontogenic abscess. Culture looks polymicrobial, continue PO antibiotics for ~3 weeks total. OK for D/C from ENT perspective, should leave steristrips in place until ENT f/u. F/U with Dr. Emeline Darling in about one week for wound check. F/U with dental/oral surgery for the right mandibular third molar.   LOS: 7 days   Melvenia Beam 08/27/2012, 1:30 PM

## 2012-08-27 NOTE — Progress Notes (Signed)
Steri strips placed on incision.  Pt received printed prescriptions as well as discounted medication letter from case manager.  Pt d/c to home.  Home meds and d/c instructions have been discussed with pt.  Pt denies any questions or concerns at this time.  Pt leaving unit via wheelchair and appears in no acute distress. Nino Glow RN

## 2012-08-27 NOTE — Progress Notes (Signed)
Family Medicine Teaching Service Daily Progress Note Service Page: [phone]  Patient Assessment: Frank [name] is a 61 y.o. year old male w/ h/o HTN and previous treatment for dental infection presenting with impressive dental abscess.   Subjective: Pt reports he is doing well today.   Objective: Temp:  [97.7 F (36.5 C)-98.2 F (36.8 C)] 97.7 F (36.5 C) (12/18 0610) Pulse Rate:  [63-78] 63  (12/18 0610) Resp:  [18-20] 18  (12/18 0610) BP: (106-116)/(73-85) 111/74 mmHg (12/18 0610) SpO2:  [97 %-99 %] 98 % (12/18 0610)  Exam: General: NAD, pleasant, cooperative HEENT: swelling still present (but decreased) on lateral aspect of right lower face. Able to open mouth more than previously. Cardiovascular: RRR Respiratory: normal respiratory effort Abd: soft, nontender, nondistended Neuro: nonfocal, speech intact  I have reviewed the patient's medications, labs, imaging, and diagnostic testing.  Notable results are summarized below.  Medications:  Scheduled Meds: Clindamycin 300mg  PO q6h Doxycycline 100mg  PO BID Chlorhexidine mouthwash BID Diltiazem 240mg  PO daily Metoprolol-XL 100mg  PO daily Senna BID  PRN Meds: Hydrocodone-acet 7.5-500mg  liquid q4h prn pain Zofran 4mg   Tylenol 650mg  q6h prn Miralax 17g daily prn  IVF: KVO  Labs:  Abscess culture - multiple organisms, none predominant (final) Anaerobic cx - no anaerobes isolated (final) Blood cx x2 NGTD  Imaging/Diagnostic Tests:  CT Maxillofacial 12/11: Large abscess in the masticator space on the right. Based on the location, this is most likely dental in origin however no definite peri apical abscess is present. Dental caries are present in the right lower third molar. The oropharynx is displaced to the left but there is no compromise of the airway.   Assessment/Plan: Frank [name] is a 61 y.o. year old male w/ h/o HTN and previous treatment for dental infection presenting with impressive  dental abscess.   1. Abscess: 5.4x3.2cm masticator abscess w/ dental origin. Pt likely did not respond to ABX from 12/8 as abscess was so large and walled off.  - s/p abscess drainage on 12/12 ([facility]) - cultures have not grown any organisms - continue Clindamycin & Doxycycline PO, will need 3 weeks of PO antibiotics. - liquid hydrocodone-acetaminophen 7.5-500mg  q4h prn pain - Per ENT, patient can be d/c'd today with ENT f/u in on eweek  2. CV: Hemodynamically stable BP and HR stable. No evidence of septic shock at this time. EKG unremarkable on admission - Continue home Metoprolol and Diltiazem  3. FEN/GI: Pt nutritional status decreased over last few days and dehydrated at time of admission. Pt received 1L NS bolus in ED. Hypokalemic on labs  - KVO IV - dysphagia 3 diet  4. Prophylaxis: - SCD 's for VTE prophylaxis  5. Disposition:  - plan for discharge today - Case mgt consulted as pt uninsured and will likely need Rx and financial assistance at time of DC   Levert Feinstein, MD [facility] PGY-1

## 2012-08-27 NOTE — Care Management Note (Signed)
  Page 1 of 1   08/27/2012     12:28:07 PM   CARE MANAGEMENT NOTE 08/27/2012  Patient:  Frank [name], Frank [name]   Account Number:  [id number]  Date Initiated:  08/22/2012  Documentation initiated by:  Ronny Flurry  Subjective/Objective Assessment:   Abscess of face     Action/Plan:   08-21-12 Right incision and drainage of right deep neck abscess   Anticipated DC Date:  08/27/2012   Anticipated DC Plan:  HOME/SELF CARE      DC Planning Services  [facility] Program      Choice offered to / List presented to:             Status of service:   Medicare Important Message given?   (If response is "NO", the following Medicare IM given date fields will be blank) Date Medicare IM given:   Date Additional Medicare IM given:    Discharge Disposition:    Per UR Regulation:  Reviewed for med. necessity/level of care/duration of stay  If discussed at Long Length of Stay Meetings, dates discussed:    Comments:  08-22-12 Will assist with meds on day of discharge. Ronny Flurry RN BSN [phone]

## 2012-08-27 NOTE — Telephone Encounter (Signed)
I called patient at home. Patient indicates that he has follow up appt with an Oral Surgeon, Dr. Reymundo Poll, tomorrow at 4:00 PM.  Zettie Pho, Dental Medicine

## 2012-08-28 NOTE — Discharge Summary (Signed)
Family Medicine Teaching [facility] Discharge Summary  Patient name: Frank [name] Medical record number: [id number] Date of birth: 1950/10/18 Age: 61 y.o. Gender: male Date of Admission: 08/20/2012  Date of Discharge: 08/27/12 Admitting Physician: Sanjuana Letters, MD  Primary Care Provider: None (previously a [facility] patient)  Indication for Hospitalization: right neck abscess  Discharge Diagnoses:  1. Odontogenic right neck abscess 2. Hypertension  Brief Hospital Course:  Frank [name] is a 61 y.o. male who was admitted to the hospital after presenting to the emergency room with complaints of a right neck abscess, after failing outpatient therapy with penicillin VK. A CT scan showed a large abscess in the right masticator space, without airway compromise. He was admitted to the hospital and put on IV clindamycin. Dr. Emeline Darling of ENT was consulted, who performed an incision and drainage of the abscess on 12/12 and added IV doxycycline to pt's antibiotic regimen. He had an uneventful postoperative course, aside from some persistent swelling for which he received IV dexamethasone. It is believed that the source of the abscess was an infected third molar, which patient will need to have removed at a later time by dentistry. Cultures obtained in the OR failed to show any growth, suggesting polymicrobial infection. He was transitioned to oral antibiotics on 12/17 and on 12/18 was deemed ready for discharge. Prior to discharge, follow up appointments were established with Dr. Emeline Darling of ENT, Dr. Jeanice Lim of Dentistry, and Dr. Konrad Dolores at [facility]. He will complete a three week course of oral clindamycin and doxycycline as an outpatient, and will see Dr. Jeanice Lim of dental on the day following discharge for evaluation of third molar extraction.  Consultations: ENT (Dr. Emeline Darling), dental (Dr. Kristin Bruins)  Procedures: 08/21/12 Incision and drainage of right deep  neck abscess (Dr. Emeline Darling)  Significant Labs and Imaging:  Abscess culture - multiple organisms, none predominant (final)  Anaerobic cx - no anaerobes isolated (final)  Blood cx x2 no growth (final)  CT Maxillofacial 08/20/12:  Large abscess in the masticator space on the right. Based on the location, this is most likely dental in origin however no definite peri apical abscess is present. Dental caries are present in the right lower third molar. The oropharynx is displaced to the left but there is no compromise of the airway.   Orthopantogram 08/21/12: Mild peri apical lucency along the base of the number 32 tooth, the right third mandibular molar. This same to shows a large carrie. This is likely the source of the patient's masticator space abscess.    Discharge Medications:    Medication List     As of 08/28/2012  2:30 AM    STOP taking these medications         penicillin v potassium 250 MG tablet   Commonly known as: VEETID      TAKE these medications         clindamycin 300 MG capsule   Commonly known as: CLEOCIN   Take 1 capsule (300 mg total) by mouth every 6 (six) hours.      diltiazem 240 MG 24 hr capsule   Commonly known as: TIAZAC   Take 240 mg by mouth daily.      doxycycline 100 MG tablet   Commonly known as: VIBRA-TABS   Take 1 tablet (100 mg total) by mouth every 12 (twelve) hours.      hydrochlorothiazide 25 MG tablet   Commonly known as: HYDRODIURIL   Take 25 mg by  mouth daily.      HYDROcodone-acetaminophen 5-325 MG per tablet   Commonly known as: NORCO/VICODIN   Take 2 tablets by mouth every 4 (four) hours as needed for pain.      metoprolol succinate 100 MG 24 hr tablet   Commonly known as: TOPROL-XL   Take 100 mg by mouth daily. Take with or immediately following a meal.       Issues for Follow Up:  -Pt will need to follow up with Dr. Jeanice Lim of dental surgery for extraction of infected third molar, which was likely the source of his  abscess -Pt is to f/u with Dr. Emeline Darling of ENT for post operative check -Pt will need to establish care at [facility] for management of chronic medical conditions. Has an appointment with Dr. Konrad Dolores scheduled. -Pt was noted to have an elevated glucose of 245 one time in the hospital, which was likely attributable to steroid administration. Would recommend screening for diabetes as appropriate in the outpatient setting  Outstanding Results: none      Follow-up Information    Follow up with MERRELL, DAVID, MD. On 09/01/2012. (at 3:15pm)    Contact information:   [facility] [address] [place] [address] [phone]       Follow up with [place],Harris L, DDS. On 08/28/2012. (Thursday December 19th @ 4:00 PM)    Contact information:   [address] [place], [address] (Dentist) [place] [address] [phone]       Follow up with Melvenia Beam, MD. On 09/04/2012. (at 12:45pm)    Contact information:   [address] [place] [address] [phone]          Discharge Condition: stable  Discharge Instructions: Please refer to Patient Instructions section of EMR for full details.  Patient was counseled important signs and symptoms that should prompt return to medical care, changes in medications, dietary instructions, activity restrictions, and follow up appointments.    Levert Feinstein, MD Family Medicine PGY-1

## 2012-08-30 NOTE — Discharge Summary (Signed)
Family Medicine Teaching Service  Discharge Note : Attending Jeff Alfreida Steffenhagen MD Pager [phone] Inpatient Team Pager:  [phone]  I have seen and examined this patient, reviewed their chart and discussed discharge planning with the resident at the time of discharge. I agree with the discharge plan as above.  

## 2012-09-01 ENCOUNTER — Encounter: Payer: Self-pay | Admitting: Family Medicine

## 2012-09-01 ENCOUNTER — Ambulatory Visit (INDEPENDENT_AMBULATORY_CARE_PROVIDER_SITE_OTHER): Payer: Self-pay | Admitting: Family Medicine

## 2012-09-01 VITALS — BP 105/70 | HR 78 | Temp 98.5°F | Ht 66.0 in | Wt 143.0 lb

## 2012-09-01 DIAGNOSIS — L0390 Cellulitis, unspecified: Secondary | ICD-10-CM

## 2012-09-01 DIAGNOSIS — B360 Pityriasis versicolor: Secondary | ICD-10-CM | POA: Insufficient documentation

## 2012-09-01 DIAGNOSIS — L0291 Cutaneous abscess, unspecified: Secondary | ICD-10-CM

## 2012-09-01 NOTE — Patient Instructions (Addendum)
Thank you for coming in today Your infection is clearing up nicely Please come back to see me for your full physical and to apply for the orange card Please continue taking your antibiotics Please apply selsun blue to your left cheek overnight then apply every other night for 2 weeks and wash off after 5 minutes.  Have a Frank [name] Christmas and Happy New Year.  Tinea Versicolor Tinea versicolor is a common yeast infection of the skin. This condition becomes known when the yeast on our skin starts to overgrow (yeast is a normal inhabitant on our skin). This condition is noticed as white or light brown patches on brown skin, and is more evident in the summer on tanned skin. These areas are slightly scaly if scratched. The light patches from the yeast become evident when the yeast creates "holes in your suntan". This is most often noticed in the summer. The patches are usually located on the chest, back, pubis, neck and body folds. However, it may occur on any area of body. Mild itching and inflammation (redness or soreness) may be present. DIAGNOSIS  The diagnosisof this is made clinically (by looking). Cultures from samples are usually not needed. Examination under the microscope may help. However, yeast is normally found on skin. The diagnosis still remains clinical. Examination under Wood's Ultraviolet Light can determine the extent of the infection. TREATMENT  This common infection is usually only of cosmetic (only a concern to your appearance). It is easily treated with dandruff shampoo used during showers or bathing. Vigorous scrubbing will eliminate the yeast over several days time. The light areas in your skin may remain for weeks or months after the infection is cured unless your skin is exposed to sunlight. The lighter or darker spots caused by the fungus that remain after complete treatment are not a sign of treatment failure; it will take a long time to resolve. Your caregiver may recommend a  number of commercial preparations or medication by mouth if home care is not working. Recurrence is common and preventative medication may be necessary. This skin condition is not highly contagious. Special care is not needed to protect close friends and family members. Normal hygiene is usually enough. Follow up is required only if you develop complications (such as a secondary infection from scratching), if recommended by your caregiver, or if no relief is obtained from the preparations used. Document Released: 08/24/2000 Document Revised: 11/19/2011 Document Reviewed: 10/06/2008 [facility] Patient Information 2013 [place], [place].   Abscess An abscess is an infected area that contains a collection of pus and debris.It can occur in almost any part of the body. An abscess is also known as a furuncle or boil. CAUSES  An abscess occurs when tissue gets infected. This can occur from blockage of oil or sweat glands, infection of hair follicles, or a minor injury to the skin. As the body tries to fight the infection, pus collects in the area and creates pressure under the skin. This pressure causes pain. People with weakened immune systems have difficulty fighting infections and get certain abscesses more often.  SYMPTOMS Usually an abscess develops on the skin and becomes a painful mass that is red, warm, and tender. If the abscess forms under the skin, you may feel a moveable soft area under the skin. Some abscesses break open (rupture) on their own, but most will continue to get worse without care. The infection can spread deeper into the body and eventually into the bloodstream, causing you to feel ill.  DIAGNOSIS  Your caregiver will take your medical history and perform a physical exam. A sample of fluid may also be taken from the abscess to determine what is causing your infection. TREATMENT  Your caregiver may prescribe antibiotic medicines to fight the infection. However, taking antibiotics alone  usually does not cure an abscess. Your caregiver may need to make a small cut (incision) in the abscess to drain the pus. In some cases, gauze is packed into the abscess to reduce pain and to continue draining the area. HOME CARE INSTRUCTIONS   Only take over-the-counter or prescription medicines for pain, discomfort, or fever as directed by your caregiver.  If you were prescribed antibiotics, take them as directed. Finish them even if you start to feel better.  If gauze is used, follow your caregiver's directions for changing the gauze.  To avoid spreading the infection:  Keep your draining abscess covered with a bandage.  Wash your hands well.  Do not share personal care items, towels, or whirlpools with others.  Avoid skin contact with others.  Keep your skin and clothes clean around the abscess.  Keep all follow-up appointments as directed by your caregiver. SEEK MEDICAL CARE IF:   You have increased pain, swelling, redness, fluid drainage, or bleeding.  You have muscle aches, chills, or a general ill feeling.  You have a fever. MAKE SURE YOU:   Understand these instructions.  Will watch your condition.  Will get help right away if you are not doing well or get worse. Document Released: 06/06/2005 Document Revised: 02/26/2012 Document Reviewed: 11/09/2011 [facility] Patient Information 2013 [place], [place].

## 2012-09-01 NOTE — Assessment & Plan Note (Signed)
Pt w/o insurance Selsun blue for now  If not resolving will try Ketoconazole

## 2012-09-13 ENCOUNTER — Encounter: Payer: Self-pay | Admitting: Family Medicine

## 2012-09-13 DIAGNOSIS — L0291 Cutaneous abscess, unspecified: Secondary | ICD-10-CM | POA: Insufficient documentation

## 2012-09-13 NOTE — Assessment & Plan Note (Addendum)
Resolving.  Pt to f/u w/ ENT Stiches removed today in clinic Pt to finish course of Doxy and Clinda

## 2012-09-13 NOTE — Progress Notes (Signed)
Frank [name] is a 62 y.o. male who presents to [facility] today for hospital follow up  Abscess: Recent extended stay in hospital for R dental and masticator muscle abscess. Drains out and feelign better today. Pain resolving. Trismus improving and able to chew food. Still on ABX. Denies fever, purulent DC, N/V, HA, CP. Feels well  HTN: Taking HCTZ and MEtop. Denies CP, syncope/lightheadedness  Hypopigmented skin lesions: hypopigmented skin of face primarily L cheek. Present for several weeks. Intermittently itchy. Never had before.    The following portions of the patient's history were reviewed and updated as appropriate: allergies, current medications, past medical history, family and social history, and problem list.  Patient is a nonsmoker.  Past Medical History  Diagnosis Date  . Hypertension   . Dental abscess 08/20/2012    "right lower jaw" (08/20/2012)    ROS as above otherwise neg.    Medications reviewed. Current Outpatient Prescriptions  Medication Sig Dispense Refill  . clindamycin (CLEOCIN) 300 MG capsule Take 1 capsule (300 mg total) by mouth every 6 (six) hours.  84 capsule  0  . diltiazem (TIAZAC) 240 MG 24 hr capsule Take 240 mg by mouth daily.      Marland Kitchen doxycycline (VIBRA-TABS) 100 MG tablet Take 1 tablet (100 mg total) by mouth every 12 (twelve) hours.  24 tablet  0  . hydrochlorothiazide (HYDRODIURIL) 25 MG tablet Take 25 mg by mouth daily.      Marland Kitchen HYDROcodone-acetaminophen (NORCO/VICODIN) 5-325 MG per tablet Take 2 tablets by mouth every 4 (four) hours as needed for pain.  10 tablet  0  . metoprolol succinate (TOPROL-XL) 100 MG 24 hr tablet Take 100 mg by mouth daily. Take with or immediately following a meal.        Exam: BP 105/70  Pulse 78  Temp 98.5 F (36.9 C)  Ht 5\' 6"  (1.676 m)  Wt 143 lb (64.864 kg)  BMI 23.08 kg/m2 Gen: Well NAD HEENT: EOMI,  MMM, R cheek still fairly indurated but w/o pain on palpation. Removal of 3 sutures, intact scar and w/o  discharge. Lungs: CTABL Nl WOB Heart: RRR no MRG Abd: NABS, NT, ND Exts: Non edematous BL  LE, warm and well perfused.   No results found for this or any previous visit (from the past 72 hour(s)).

## 2013-10-15 ENCOUNTER — Other Ambulatory Visit: Payer: Self-pay | Admitting: Chiropractic Medicine

## 2013-10-15 ENCOUNTER — Ambulatory Visit
Admission: RE | Admit: 2013-10-15 | Discharge: 2013-10-15 | Disposition: A | Discharge status: Home / Self Care | Payer: BC Managed Care – PPO | Source: Ambulatory Visit | Attending: Chiropractic Medicine | Admitting: Chiropractic Medicine

## 2013-10-15 DIAGNOSIS — M545 Low back pain, unspecified: Secondary | ICD-10-CM

## 2013-10-15 DIAGNOSIS — M25552 Pain in left hip: Secondary | ICD-10-CM

## 2013-10-15 DIAGNOSIS — M5412 Radiculopathy, cervical region: Secondary | ICD-10-CM

## 2013-10-15 DIAGNOSIS — M5416 Radiculopathy, lumbar region: Secondary | ICD-10-CM

## 2014-05-06 DIAGNOSIS — I10 Essential (primary) hypertension: Secondary | ICD-10-CM | POA: Insufficient documentation

## 2014-06-03 NOTE — H&P (Signed)
  Jalie Eiland/[facility] [address]   [address] [place], [place] [address] [phone] A Division of [facility]  Loreta Ave, M.D.   Robert A. Thurston Hole, M.D.   Burnell Blanks, M.D.   Eulas Post, M.D.   Lunette Stands, M.D Jewel Baize. Eulah Pont, M.D.  Buford Dresser, M.D.  Estell Harpin, M.D.    Melina Fiddler, M.D. Mary L. Isidoro Donning, PA-C  Kirstin A. Shepperson, PA-C  Josh [place], PA-C [place], [place]   RE: Frank, [name]   [id number]      DOB: [birth year] PROGRESS NOTE: 04-16-14 Reason for visit:  Evaluation of left hip pain. History of present illness: He has had atraumatic onset of left hip pain over the last several years. He has constant pain and difficulty with range of motion. He has tried over the counter medications without relief.    Please see associated documentation for this clinic visit for further past medical, family, surgical and social history, review of systems, and exam findings as this was reviewed by me.  EXAMINATION: Well appearing male in no apparent distress. The left lower extremity is neurovascularly intact. He has severely limited range of motion positive Stinchfield and negative straight leg raise.  IMAGING: X-rays reviewed by me: 2 views of the left hip demonstrates severe osteoarthritis.     ASSESSMENT/PLAN: 1. I had a long discussion with him about his options. 2. He can continue PO medications and maintain good weight control, however given x-rays I think he will still have pain. 3. I do not think he would benefit from an injection. 4. Discussed risks benefits and possible complications of total hip replacement and he would like to go forward with this. He will meet with Dr. Richardson Landry and then decide who he would like to do that surgery.   Jewel Baize.  Eulah Pont, M.D.  Electronically verified by Jewel Baize. Eulah Pont, M.D. TDM:kah Cc:  Willey Blade, MD fax [phone]  D 04-16-14 T  04-16-14

## 2014-06-16 ENCOUNTER — Encounter (HOSPITAL_COMMUNITY): Payer: Self-pay

## 2014-06-18 NOTE — Pre-Procedure Instructions (Signed)
Frank [name]  06/18/2014   Your procedure is scheduled NW:GNFAOZHon:Tuesday, June 29, 2014  [facility] at 8:00 AM.  Call this number if you have problems the morning of surgery: [phone]   Remember:   Do not eat food or drink liquids after midnight Monday, June 28, 2014   Take these medicines the morning of surgery with A SIP OF WATER: diltiazem [facility](TIAZAC), metoprolol   Stop taking Aspirin, vitamins, and herbal medications. Do not take any NSAIDs ie: Ibuprofen, Advil, Naproxen or any medication containing Aspirin; Stop 5 days prior to procedure.   Do not wear jewelry, make-up or nail polish.  Do not wear lotions, powders, or perfumes. You may not wear deodorant.   Men may shave face and neck.  Do not bring valuables to the hospital.  [facility] is not responsible for any belongings or valuables.               Contacts, dentures or bridgework may not be worn into surgery.  Leave suitcase in the car. After surgery it may be brought to your room.  For patients admitted to the hospital, discharge time is determined by your treatment team.               Patients discharged the day of surgery will not be allowed to drive home.  Name and phone number of your driver:  Special Instructions:  Special Instructions:Special Instructions: [facility] - Preparing for Surgery  Before surgery, you can play an important role.  Because skin is not sterile, your skin needs to be as free of germs as possible.  You can reduce the number of germs on you skin by washing with CHG (chlorahexidine gluconate) soap before surgery.  CHG is an antiseptic cleaner which kills germs and bonds with the skin to continue killing germs even after washing.  Please DO NOT use if you have an allergy to CHG or antibacterial soaps.  If your skin becomes reddened/irritated stop using the CHG and inform your nurse when you arrive at Short Stay.  Do not shave (including legs and underarms) for  at least 48 hours prior to the first CHG shower.  You may shave your face.  Please follow these instructions carefully:   1.  Shower with CHG Soap the night before surgery and the morning of Surgery.  2.  If you choose to wash your hair, wash your hair first as usual with your normal shampoo.  3.  After you shampoo, rinse your hair and body thoroughly to remove the Shampoo.  4.  Use CHG as you would any other liquid soap.  You can apply chg directly  to the skin and wash gently with scrungie or a clean washcloth.  5.  Apply the CHG Soap to your body ONLY FROM THE NECK DOWN.  Do not use on open wounds or open sores.  Avoid contact with your eyes, ears, mouth and genitals (private parts).  Wash genitals (private parts) with your normal soap.  6.  Wash thoroughly, paying special attention to the area where your surgery will be performed.  7.  Thoroughly rinse your body with warm water from the neck down.  8.  DO NOT shower/wash with your normal soap after using and rinsing off the CHG Soap.  9.  Pat yourself dry with a clean towel.            10.  Wear clean pajamas.  11.  Place clean sheets on your bed the night of your first shower and do not sleep with pets.  Day of Surgery  Do not apply any lotions/deodorants the morning of surgery.  Please wear clean clothes to the hospital/surgery center.   Please read over the following fact sheets that you were given: Pain Booklet, Coughing and Deep Breathing, Blood Transfusion Information, Total Joint Packet, MRSA Information and Surgical Site Infection Prevention

## 2014-06-21 ENCOUNTER — Encounter (HOSPITAL_COMMUNITY): Payer: Self-pay

## 2014-06-21 ENCOUNTER — Encounter (HOSPITAL_COMMUNITY)
Admission: RE | Admit: 2014-06-21 | Discharge: 2014-06-21 | Disposition: A | Discharge status: Home / Self Care | Payer: Medicare HMO | Source: Ambulatory Visit | Attending: Orthopedic Surgery | Admitting: Orthopedic Surgery

## 2014-06-21 ENCOUNTER — Ambulatory Visit (HOSPITAL_COMMUNITY)
Admission: RE | Admit: 2014-06-21 | Discharge: 2014-06-21 | Disposition: A | Discharge status: Home / Self Care | Payer: Medicare HMO | Source: Ambulatory Visit | Attending: Orthopedic Surgery | Admitting: Orthopedic Surgery

## 2014-06-21 DIAGNOSIS — Z01818 Encounter for other preprocedural examination: Secondary | ICD-10-CM

## 2014-06-21 DIAGNOSIS — M1990 Unspecified osteoarthritis, unspecified site: Secondary | ICD-10-CM | POA: Diagnosis not present

## 2014-06-21 LAB — URINALYSIS, ROUTINE W REFLEX MICROSCOPIC
BILIRUBIN URINE: NEGATIVE
Glucose, UA: NEGATIVE mg/dL
HGB URINE DIPSTICK: NEGATIVE
Ketones, ur: NEGATIVE mg/dL
Leukocytes, UA: NEGATIVE
NITRITE: NEGATIVE
PH: 6 (ref 5.0–8.0)
Protein, ur: NEGATIVE mg/dL
SPECIFIC GRAVITY, URINE: 1.025 (ref 1.005–1.030)
UROBILINOGEN UA: 1 mg/dL (ref 0.0–1.0)

## 2014-06-21 LAB — PROTIME-INR
INR: 1 (ref 0.00–1.49)
PROTHROMBIN TIME: 13.2 s (ref 11.6–15.2)

## 2014-06-21 LAB — CBC
HCT: 42.9 % (ref 39.0–52.0)
Hemoglobin: 14.5 g/dL (ref 13.0–17.0)
MCH: 28.4 pg (ref 26.0–34.0)
MCHC: 33.8 g/dL (ref 30.0–36.0)
MCV: 84.1 fL (ref 78.0–100.0)
PLATELETS: 172 10*3/uL (ref 150–400)
RBC: 5.1 MIL/uL (ref 4.22–5.81)
RDW: 13.4 % (ref 11.5–15.5)
WBC: 8.2 10*3/uL (ref 4.0–10.5)

## 2014-06-21 LAB — BASIC METABOLIC PANEL
Anion gap: 13 (ref 5–15)
BUN: 15 mg/dL (ref 6–23)
CALCIUM: 9.8 mg/dL (ref 8.4–10.5)
CO2: 28 mEq/L (ref 19–32)
CREATININE: 1.02 mg/dL (ref 0.50–1.35)
Chloride: 98 mEq/L (ref 96–112)
GFR, EST AFRICAN AMERICAN: 88 mL/min — AB (ref >90)
GFR, EST NON AFRICAN AMERICAN: 76 mL/min — AB (ref >90)
GLUCOSE: 101 mg/dL — AB (ref 70–99)
Potassium: 3.9 mEq/L (ref 3.7–5.3)
Sodium: 139 mEq/L (ref 137–147)

## 2014-06-21 LAB — TYPE AND SCREEN
ABO/RH(D): O POS
Antibody Screen: NEGATIVE

## 2014-06-21 LAB — SURGICAL PCR SCREEN
MRSA, PCR: NEGATIVE
Staphylococcus aureus: POSITIVE — AB

## 2014-06-21 LAB — APTT: APTT: 41 s — AB (ref 24–37)

## 2014-06-22 LAB — URINE CULTURE
COLONY COUNT: NO GROWTH
Culture: NO GROWTH

## 2014-06-22 NOTE — Progress Notes (Addendum)
Anesthesia Chart Review:  Patient is a 63 year old male posted for for left THA, anterior approach on 06/29/14 by Dr. Margarita Ranaimothy Murphy. Case is posted for GA.    History includes non-smoker, HTN, arthritis, dental abscess '13. PCP office notes with Thad RangerPanny Jones, FNP are under Care Everywhere. She had him undergo echo and renal ultrasound which were "both normal overall," and she ultimately cleared on 05/24/14.    Meds: diltizaem, losartan-HCTZ, metoprolol succinate.  EKG on 04/21/14 [facility] showed: NSR, LA abnormality, LAFB, probable LVH. Tracing is quite dark.  Will defer decision to repeat to his assigned anesthesiologist.    CXR on 06/21/14 showed: No edema or consolidation. Evidence of a degree of underlying emphysematous change.  Preoperative labs noted.  Urine culture is still pending.  I spoke with patient briefly over the phone.  He said that echo and renal ultrasound were done at his PCP office.  I have faxed a request for those reports to be sent.  I'll follow-up once received.  Velna Ochsllison Zelenak, PA-C [facility]/Anesthesiology Phone [phone] 06/22/2014 5:09 PM  Addendum 06/23/2014:  Received results of renal US and echo.   Echo 05/18/2014: -LV is normal size. Moderate concentric hypertrophy of LV. Normal diastolic filling pattern. No LV regional wall motion abnormalities. EF 55%.  -Trace aortic regurgitation -Trace mitral regurgitation -Trace tricuspid regurgitation -No evidence pulmonary HTN  Renal US 05/21/2014: -No evidence renal artery stenosis.   Urine culture negative.   I anticipate he can proceed with surgery as scheduled.   Rica Mastngela Prentiss Hammett, FNP-BC [facility]/Anesthesiology Phone: [phone] 06/23/2014 3:20 PM

## 2014-06-28 MED ORDER — ACETAMINOPHEN 500 MG PO TABS
1000.0000 mg | ORAL_TABLET | Freq: Once | ORAL | Status: AC
  Administered 2014-06-29: 1000 mg via ORAL
  Filled 2014-06-28: qty 2

## 2014-06-28 MED ORDER — TRANEXAMIC ACID 100 MG/ML IV SOLN
1000.0000 mg | INTRAVENOUS | Status: AC
  Administered 2014-06-29: 1000 mg via INTRAVENOUS
  Filled 2014-06-28: qty 10

## 2014-06-28 MED ORDER — CEFAZOLIN SODIUM-DEXTROSE 2-3 GM-% IV SOLR
2.0000 g | INTRAVENOUS | Status: AC
  Administered 2014-06-29: 2 g via INTRAVENOUS
  Filled 2014-06-28: qty 50

## 2014-06-29 ENCOUNTER — Encounter (HOSPITAL_COMMUNITY): Payer: Self-pay | Admitting: *Deleted

## 2014-06-29 ENCOUNTER — Inpatient Hospital Stay (HOSPITAL_COMMUNITY): Payer: BC Managed Care – PPO

## 2014-06-29 ENCOUNTER — Inpatient Hospital Stay (HOSPITAL_COMMUNITY)
Admission: RE | Admit: 2014-06-29 | Discharge: 2014-07-02 | DRG: 470 | Disposition: A | Discharge status: Home Health | Payer: BC Managed Care – PPO | Source: Ambulatory Visit | Attending: Orthopedic Surgery | Admitting: Orthopedic Surgery

## 2014-06-29 ENCOUNTER — Inpatient Hospital Stay (HOSPITAL_COMMUNITY): Payer: BC Managed Care – PPO | Admitting: Certified Registered Nurse Anesthetist

## 2014-06-29 ENCOUNTER — Encounter (HOSPITAL_COMMUNITY)
Admission: RE | Disposition: A | Discharge status: Home Health | Payer: Self-pay | Source: Ambulatory Visit | Attending: Orthopedic Surgery

## 2014-06-29 ENCOUNTER — Encounter (HOSPITAL_COMMUNITY): Payer: BC Managed Care – PPO | Admitting: Vascular Surgery

## 2014-06-29 DIAGNOSIS — I10 Essential (primary) hypertension: Secondary | ICD-10-CM | POA: Diagnosis present

## 2014-06-29 DIAGNOSIS — M1610 Unilateral primary osteoarthritis, unspecified hip: Secondary | ICD-10-CM

## 2014-06-29 DIAGNOSIS — M1612 Unilateral primary osteoarthritis, left hip: Principal | ICD-10-CM | POA: Diagnosis present

## 2014-06-29 DIAGNOSIS — Z8739 Personal history of other diseases of the musculoskeletal system and connective tissue: Secondary | ICD-10-CM | POA: Insufficient documentation

## 2014-06-29 DIAGNOSIS — M1990 Unspecified osteoarthritis, unspecified site: Secondary | ICD-10-CM | POA: Diagnosis present

## 2014-06-29 DIAGNOSIS — M25552 Pain in left hip: Secondary | ICD-10-CM | POA: Diagnosis present

## 2014-06-29 HISTORY — PX: TOTAL HIP ARTHROPLASTY: SHX124

## 2014-06-29 SURGERY — ARTHROPLASTY, HIP, TOTAL, ANTERIOR APPROACH
Anesthesia: Monitor Anesthesia Care | Site: Hip | Laterality: Left

## 2014-06-29 MED ORDER — DILTIAZEM HCL ER BEADS 240 MG PO CP24
240.0000 mg | ORAL_CAPSULE | Freq: Every day | ORAL | Status: DC
  Administered 2014-06-30 – 2014-07-02 (×3): 240 mg via ORAL
  Filled 2014-06-29 (×5): qty 1

## 2014-06-29 MED ORDER — OXYCODONE HCL 5 MG/5ML PO SOLN: 5.0000 mg | Freq: Once | ORAL | Status: AC | PRN

## 2014-06-29 MED ORDER — DEXTROSE-NACL 5-0.45 % IV SOLN
INTRAVENOUS | Status: DC
  Administered 2014-06-29: 18:00:00 via INTRAVENOUS

## 2014-06-29 MED ORDER — ASPIRIN EC 325 MG PO TBEC
325.0000 mg | DELAYED_RELEASE_TABLET | Freq: Every day | ORAL | Status: DC
  Administered 2014-06-30 – 2014-07-02 (×3): 325 mg via ORAL
  Filled 2014-06-29 (×4): qty 1

## 2014-06-29 MED ORDER — ACETAMINOPHEN 325 MG PO TABS: 650.0000 mg | ORAL_TABLET | Freq: Four times a day (QID) | ORAL | Status: DC | PRN

## 2014-06-29 MED ORDER — CEFAZOLIN SODIUM-DEXTROSE 2-3 GM-% IV SOLR
2.0000 g | Freq: Four times a day (QID) | INTRAVENOUS | Status: AC
  Administered 2014-06-29 – 2014-06-30 (×2): 2 g via INTRAVENOUS
  Filled 2014-06-29 (×2): qty 50

## 2014-06-29 MED ORDER — OXYCODONE HCL 5 MG PO TABS
5.0000 mg | ORAL_TABLET | Freq: Once | ORAL | Status: AC | PRN
  Administered 2014-06-29: 5 mg via ORAL

## 2014-06-29 MED ORDER — OXYCODONE HCL 5 MG PO TABS
ORAL_TABLET | ORAL | Status: AC
  Filled 2014-06-29: qty 1

## 2014-06-29 MED ORDER — DOCUSATE SODIUM 100 MG PO CAPS
100.0000 mg | ORAL_CAPSULE | Freq: Two times a day (BID) | ORAL | Status: DC
  Administered 2014-06-29 – 2014-07-02 (×6): 100 mg via ORAL
  Filled 2014-06-29 (×6): qty 1

## 2014-06-29 MED ORDER — HYDROCODONE-ACETAMINOPHEN 5-325 MG PO TABS: 1.0000 | ORAL_TABLET | ORAL | Status: DC | PRN

## 2014-06-29 MED ORDER — LOSARTAN POTASSIUM-HCTZ 100-25 MG PO TABS: 1.0000 | ORAL_TABLET | Freq: Every day | ORAL | Status: DC

## 2014-06-29 MED ORDER — METHOCARBAMOL 1000 MG/10ML IJ SOLN
500.0000 mg | Freq: Four times a day (QID) | INTRAVENOUS | Status: DC | PRN
  Filled 2014-06-29: qty 5

## 2014-06-29 MED ORDER — MUPIROCIN 2 % EX OINT
1.0000 "application " | TOPICAL_OINTMENT | Freq: Two times a day (BID) | CUTANEOUS | Status: DC
  Administered 2014-06-29 – 2014-07-02 (×6): 1 via NASAL
  Filled 2014-06-29 (×2): qty 22

## 2014-06-29 MED ORDER — METOCLOPRAMIDE HCL 5 MG PO TABS: 5.0000 mg | ORAL_TABLET | Freq: Three times a day (TID) | ORAL | Status: DC | PRN

## 2014-06-29 MED ORDER — HYDROCHLOROTHIAZIDE 25 MG PO TABS
25.0000 mg | ORAL_TABLET | Freq: Every day | ORAL | Status: DC
  Administered 2014-06-29 – 2014-07-02 (×4): 25 mg via ORAL
  Filled 2014-06-29 (×5): qty 1

## 2014-06-29 MED ORDER — ASPIRIN EC 325 MG PO TBEC: 325.0000 mg | DELAYED_RELEASE_TABLET | Freq: Every day | ORAL | Status: DC

## 2014-06-29 MED ORDER — EPHEDRINE SULFATE 50 MG/ML IJ SOLN
INTRAMUSCULAR | Status: DC | PRN
  Administered 2014-06-29: 5 mg via INTRAVENOUS
  Administered 2014-06-29 (×3): 10 mg via INTRAVENOUS
  Administered 2014-06-29: 5 mg via INTRAVENOUS
  Administered 2014-06-29: 10 mg via INTRAVENOUS

## 2014-06-29 MED ORDER — MORPHINE SULFATE 2 MG/ML IJ SOLN: 2.0000 mg | INTRAMUSCULAR | Status: DC | PRN

## 2014-06-29 MED ORDER — PHENYLEPHRINE HCL 10 MG/ML IJ SOLN
INTRAMUSCULAR | Status: DC | PRN
  Administered 2014-06-29 (×2): 80 ug via INTRAVENOUS
  Administered 2014-06-29: 40 ug via INTRAVENOUS
  Administered 2014-06-29: 80 ug via INTRAVENOUS
  Administered 2014-06-29 (×2): 40 ug via INTRAVENOUS

## 2014-06-29 MED ORDER — SODIUM CHLORIDE 0.9 % IR SOLN
Status: DC | PRN
  Administered 2014-06-29: 3000 mL

## 2014-06-29 MED ORDER — LOSARTAN POTASSIUM 50 MG PO TABS
100.0000 mg | ORAL_TABLET | Freq: Every day | ORAL | Status: DC
  Administered 2014-06-29 – 2014-07-02 (×4): 100 mg via ORAL
  Filled 2014-06-29 (×5): qty 2

## 2014-06-29 MED ORDER — ONDANSETRON HCL 4 MG/2ML IJ SOLN: 4.0000 mg | Freq: Four times a day (QID) | INTRAMUSCULAR | Status: DC | PRN

## 2014-06-29 MED ORDER — ARTIFICIAL TEARS OP OINT
TOPICAL_OINTMENT | OPHTHALMIC | Status: DC | PRN
  Administered 2014-06-29: .2 via OPHTHALMIC

## 2014-06-29 MED ORDER — HYDROCODONE-ACETAMINOPHEN 5-325 MG PO TABS
1.0000 | ORAL_TABLET | ORAL | Status: DC | PRN
Start: 2014-06-29 — End: 2014-07-02
  Administered 2014-06-29 – 2014-07-02 (×8): 2 via ORAL
  Filled 2014-06-29 (×8): qty 2

## 2014-06-29 MED ORDER — BUPIVACAINE HCL (PF) 0.5 % IJ SOLN
INTRAMUSCULAR | Status: DC | PRN
  Administered 2014-06-29: 3 mL

## 2014-06-29 MED ORDER — MELOXICAM 15 MG PO TABS: 15.0000 mg | ORAL_TABLET | Freq: Every day | ORAL | Status: DC

## 2014-06-29 MED ORDER — MENTHOL 3 MG MT LOZG
1.0000 | LOZENGE | OROMUCOSAL | Status: DC | PRN
  Filled 2014-06-29: qty 9

## 2014-06-29 MED ORDER — PROMETHAZINE HCL 25 MG/ML IJ SOLN: 6.2500 mg | INTRAMUSCULAR | Status: DC | PRN

## 2014-06-29 MED ORDER — HYDROMORPHONE HCL 1 MG/ML IJ SOLN
INTRAMUSCULAR | Status: AC
  Administered 2014-06-29: 18:00:00
  Filled 2014-06-29: qty 1

## 2014-06-29 MED ORDER — LACTATED RINGERS IV SOLN
INTRAVENOUS | Status: DC
  Administered 2014-06-29: 08:00:00 via INTRAVENOUS

## 2014-06-29 MED ORDER — MIDAZOLAM HCL 5 MG/5ML IJ SOLN
INTRAMUSCULAR | Status: DC | PRN
  Administered 2014-06-29: 2 mg via INTRAVENOUS

## 2014-06-29 MED ORDER — METHOCARBAMOL 500 MG PO TABS
500.0000 mg | ORAL_TABLET | Freq: Four times a day (QID) | ORAL | Status: DC | PRN
  Administered 2014-06-29 – 2014-06-30 (×2): 500 mg via ORAL
  Filled 2014-06-29 (×3): qty 1

## 2014-06-29 MED ORDER — METOCLOPRAMIDE HCL 5 MG/ML IJ SOLN: 5.0000 mg | Freq: Three times a day (TID) | INTRAMUSCULAR | Status: DC | PRN

## 2014-06-29 MED ORDER — PHENOL 1.4 % MT LIQD: 1.0000 | OROMUCOSAL | Status: DC | PRN

## 2014-06-29 MED ORDER — CHLORHEXIDINE GLUCONATE CLOTH 2 % EX PADS
6.0000 | MEDICATED_PAD | Freq: Every day | CUTANEOUS | Status: DC
  Administered 2014-06-29 – 2014-06-30 (×2): 6 via TOPICAL

## 2014-06-29 MED ORDER — PROPOFOL 10 MG/ML IV BOLUS
INTRAVENOUS | Status: AC
  Filled 2014-06-29: qty 20

## 2014-06-29 MED ORDER — 0.9 % SODIUM CHLORIDE (POUR BTL) OPTIME
TOPICAL | Status: DC | PRN
  Administered 2014-06-29: 1000 mL

## 2014-06-29 MED ORDER — LACTATED RINGERS IV SOLN
INTRAVENOUS | Status: DC | PRN
  Administered 2014-06-29 (×2): via INTRAVENOUS

## 2014-06-29 MED ORDER — HYDROMORPHONE HCL 1 MG/ML IJ SOLN
0.2500 mg | INTRAMUSCULAR | Status: DC | PRN
  Administered 2014-06-29 (×2): 0.5 mg via INTRAVENOUS

## 2014-06-29 MED ORDER — ACETAMINOPHEN 650 MG RE SUPP: 650.0000 mg | Freq: Four times a day (QID) | RECTAL | Status: DC | PRN

## 2014-06-29 MED ORDER — CHLORHEXIDINE GLUCONATE 4 % EX LIQD: 60.0000 mL | Freq: Once | CUTANEOUS | Status: DC

## 2014-06-29 MED ORDER — FENTANYL CITRATE 0.05 MG/ML IJ SOLN
INTRAMUSCULAR | Status: DC | PRN
  Administered 2014-06-29: 50 ug via INTRAVENOUS

## 2014-06-29 MED ORDER — ONDANSETRON HCL 4 MG PO TABS: 4.0000 mg | ORAL_TABLET | Freq: Four times a day (QID) | ORAL | Status: DC | PRN

## 2014-06-29 MED ORDER — DEXTROSE-NACL 5-0.45 % IV SOLN: 100.0000 mL/h | INTRAVENOUS | Status: DC

## 2014-06-29 MED ORDER — ONDANSETRON HCL 4 MG/2ML IJ SOLN
INTRAMUSCULAR | Status: DC | PRN
  Administered 2014-06-29: 4 mg via INTRAVENOUS

## 2014-06-29 MED ORDER — METOPROLOL SUCCINATE ER 100 MG PO TB24
100.0000 mg | ORAL_TABLET | Freq: Every day | ORAL | Status: DC
  Administered 2014-06-30 – 2014-07-02 (×2): 100 mg via ORAL
  Filled 2014-06-29 (×4): qty 1

## 2014-06-29 MED ORDER — MIDAZOLAM HCL 2 MG/2ML IJ SOLN
INTRAMUSCULAR | Status: AC
  Filled 2014-06-29: qty 2

## 2014-06-29 MED ORDER — METHOCARBAMOL 500 MG PO TABS
ORAL_TABLET | ORAL | Status: AC
  Filled 2014-06-29: qty 1

## 2014-06-29 MED ORDER — MELOXICAM 15 MG PO TABS
15.0000 mg | ORAL_TABLET | Freq: Every day | ORAL | Status: DC
  Administered 2014-06-29 – 2014-07-02 (×4): 15 mg via ORAL
  Filled 2014-06-29 (×4): qty 1

## 2014-06-29 MED ORDER — FENTANYL CITRATE 0.05 MG/ML IJ SOLN
INTRAMUSCULAR | Status: AC
  Filled 2014-06-29: qty 5

## 2014-06-29 MED ORDER — PROPOFOL INFUSION 10 MG/ML OPTIME
INTRAVENOUS | Status: DC | PRN
  Administered 2014-06-29: 30 ug/kg/min via INTRAVENOUS

## 2014-06-29 MED ORDER — DEXAMETHASONE SODIUM PHOSPHATE 10 MG/ML IJ SOLN
10.0000 mg | Freq: Once | INTRAMUSCULAR | Status: AC
  Administered 2014-06-30: 10 mg via INTRAVENOUS
  Filled 2014-06-29: qty 1

## 2014-06-29 MED ORDER — DOCUSATE SODIUM 100 MG PO CAPS
100.0000 mg | ORAL_CAPSULE | Freq: Two times a day (BID) | ORAL | Status: DC
Start: 2014-06-29 — End: 2015-02-28

## 2014-06-29 SURGICAL SUPPLY — 61 items
ADH SKN CLS APL DERMABOND .7 (GAUZE/BANDAGES/DRESSINGS) ×1
BLADE SAW SGTL 18X1.27X75 (BLADE) ×2 IMPLANT
BLADE SAW SGTL 18X1.27X75MM (BLADE) ×1
BLADE SURG ROTATE 9660 (MISCELLANEOUS) IMPLANT
COVER SURGICAL LIGHT HANDLE (MISCELLANEOUS) ×3 IMPLANT
DERMABOND ADVANCED (GAUZE/BANDAGES/DRESSINGS) ×2
DERMABOND ADVANCED .7 DNX12 (GAUZE/BANDAGES/DRESSINGS) IMPLANT
DRAPE C-ARM 42X72 X-RAY (DRAPES) ×3 IMPLANT
DRAPE IMP U-DRAPE 54X76 (DRAPES) ×3 IMPLANT
DRAPE INCISE IOBAN 66X45 STRL (DRAPES) ×3 IMPLANT
DRAPE ORTHO SPLIT 77X108 STRL (DRAPES) ×6
DRAPE PROXIMA HALF (DRAPES) ×3 IMPLANT
DRAPE SURG 17X23 STRL (DRAPES) ×3 IMPLANT
DRAPE SURG ORHT 6 SPLT 77X108 (DRAPES) ×2 IMPLANT
DRAPE U-SHAPE 47X51 STRL (DRAPES) ×6 IMPLANT
DRSG AQUACEL AG ADV 3.5X10 (GAUZE/BANDAGES/DRESSINGS) ×3 IMPLANT
DURAPREP 26ML APPLICATOR (WOUND CARE) ×3 IMPLANT
ELECT BLADE 4.0 EZ CLEAN MEGAD (MISCELLANEOUS) ×3
ELECT CAUTERY BLADE 6.4 (BLADE) ×3 IMPLANT
ELECT REM PT RETURN 9FT ADLT (ELECTROSURGICAL) ×3
ELECTRODE BLDE 4.0 EZ CLN MEGD (MISCELLANEOUS) ×1 IMPLANT
ELECTRODE REM PT RTRN 9FT ADLT (ELECTROSURGICAL) ×1 IMPLANT
FACESHIELD WRAPAROUND (MASK) ×6 IMPLANT
FACESHIELD WRAPAROUND OR TEAM (MASK) ×2 IMPLANT
GLOVE BIO SURGEON STRL SZ7.5 (GLOVE) ×3 IMPLANT
GLOVE BIOGEL M 7.0 STRL (GLOVE) IMPLANT
GLOVE BIOGEL PI IND STRL 7.5 (GLOVE) IMPLANT
GLOVE BIOGEL PI IND STRL 8 (GLOVE) ×2 IMPLANT
GLOVE BIOGEL PI INDICATOR 7.5 (GLOVE)
GLOVE BIOGEL PI INDICATOR 8 (GLOVE) ×4
GLOVE BIOGEL PI ORTHO PRO SZ8 (GLOVE)
GLOVE PI ORTHO PRO STRL SZ8 (GLOVE) IMPLANT
GLOVE SURG ORTHO 8.0 STRL STRW (GLOVE) IMPLANT
GOWN STRL REUS W/ TWL LRG LVL3 (GOWN DISPOSABLE) ×3 IMPLANT
GOWN STRL REUS W/ TWL XL LVL3 (GOWN DISPOSABLE) ×1 IMPLANT
GOWN STRL REUS W/TWL LRG LVL3 (GOWN DISPOSABLE) ×9
GOWN STRL REUS W/TWL XL LVL3 (GOWN DISPOSABLE) ×3
HIP/CERM HD VIT E LINR LEV 1C ×2 IMPLANT
KIT BASIN OR (CUSTOM PROCEDURE TRAY) ×3 IMPLANT
KIT ROOM TURNOVER OR (KITS) ×3 IMPLANT
MANIFOLD NEPTUNE II (INSTRUMENTS) ×3 IMPLANT
NDL 18GX1X1/2 (RX/OR ONLY) (NEEDLE) ×1 IMPLANT
NDL SAFETY ECLIPSE 18X1.5 (NEEDLE) ×1 IMPLANT
NEEDLE 18GX1X1/2 (RX/OR ONLY) (NEEDLE) ×3 IMPLANT
NEEDLE HYPO 18GX1.5 SHARP (NEEDLE) ×3
NS IRRIG 1000ML POUR BTL (IV SOLUTION) ×3 IMPLANT
PACK TOTAL JOINT (CUSTOM PROCEDURE TRAY) ×3 IMPLANT
PAD ARMBOARD 7.5X6 YLW CONV (MISCELLANEOUS) ×6 IMPLANT
SPONGE LAP 18X18 X RAY DECT (DISPOSABLE) IMPLANT
SUT MNCRL AB 4-0 PS2 18 (SUTURE) ×3 IMPLANT
SUT MON AB 2-0 CT1 36 (SUTURE) ×3 IMPLANT
SUT VIC AB 0 CT1 27 (SUTURE) ×3
SUT VIC AB 0 CT1 27XBRD ANBCTR (SUTURE) ×1 IMPLANT
SUT VIC AB 1 CT1 27 (SUTURE) ×3
SUT VIC AB 1 CT1 27XBRD ANBCTR (SUTURE) ×1 IMPLANT
SYR 50ML LL SCALE MARK (SYRINGE) ×3 IMPLANT
TOWEL OR 17X24 6PK STRL BLUE (TOWEL DISPOSABLE) ×3 IMPLANT
TOWEL OR 17X26 10 PK STRL BLUE (TOWEL DISPOSABLE) ×3 IMPLANT
TOWEL OR NON WOVEN STRL DISP B (DISPOSABLE) ×3 IMPLANT
TRAY FOLEY CATH 16FRSI W/METER (SET/KITS/TRAYS/PACK) IMPLANT
WATER STERILE IRR 1000ML POUR (IV SOLUTION) ×3 IMPLANT

## 2014-06-29 NOTE — Interval H&P Note (Signed)
History and Physical Interval Note:  06/29/2014 7:19 AM  Frank [name]  has presented today for surgery, with the diagnosis of OA LEFT HIP  The various methods of treatment have been discussed with the patient and family. After consideration of risks, benefits and other options for treatment, the patient has consented to  Procedure(s): LEFT TOTAL HIP ARTHROPLASTY ANTERIOR APPROACH (Left) as a surgical intervention .  The patient's history has been reviewed, patient examined, no change in status, stable for surgery.  I have reviewed the patient's chart and labs.  Questions were answered to the patient's satisfaction.     Latwan Luchsinger, D

## 2014-06-29 NOTE — Op Note (Signed)
06/29/2014  1:15 PM  PATIENT:  Frank [name]   MRN: [id number]  PRE-OPERATIVE DIAGNOSIS:  OA LEFT HIP  POST-OPERATIVE DIAGNOSIS:  osteoarthritis left hip  PROCEDURE:  Procedure(s): LEFT TOTAL HIP ARTHROPLASTY ANTERIOR APPROACH  PREOPERATIVE INDICATIONS:    Frank [name] is an 63 y.o. male who has a diagnosis of <principal problem not specified> and elected for surgical management after failing conservative treatment.  The risks benefits and alternatives were discussed with the patient including but not limited to the risks of nonoperative treatment, versus surgical intervention including infection, bleeding, nerve injury, periprosthetic fracture, the need for revision surgery, dislocation, leg length discrepancy, blood clots, cardiopulmonary complications, morbidity, mortality, among others, and they were willing to proceed.     OPERATIVE REPORT     SURGEON:   Renette Butters, MD    ASSISTANT:  Joya Gaskins, OPA, He was necessary for efficiency and safety of the case.     ANESTHESIA:  General    COMPLICATIONS:  None.     COMPONENTS:  Stryker acolade fit femur size 3 with a 36 mm +0 head ball and a PSL acetabular shell size 52 with a  polyethylene liner    PROCEDURE IN DETAIL:   The patient was met in the holding area and  identified.  The appropriate hip was identified and marked at the operative site.  The patient was then transported to the OR  and  placed under general anesthesia.  At that point, the patient was  placed in the supine position and  secured to the operating room table and all bony prominences padded. He received pre-operative antibiotics    The operative lower extremity was prepped from the iliac crest to the distal leg.  Sterile draping was performed.  Time out was performed prior to incision.      Skin incision was made just 2 cm lateral to the ASIS  extending in line with the tensor fascia lata. Electrocautery was used to control all bleeders. I  dissected down sharply to the fascia of the tensor fascia lata was confirmed that the muscle fibers beneath were running posteriorly. I then incised the fascia over the superficial tensor fascia lata in line with the incision. The fascia was elevated off the anterior aspect of the muscle the muscle was retracted posteriorly and protected throughout the case. I then used electrocautery to incise the tensor fascia lata fascia control and all bleeders. Immediately visible was the fat over top of the anterior neck and capsule.  I removed the anterior fat from the capsule and elevated the rectus muscle off of the anterior capsule. I then removed a large time of capsule. The retractors were then placed over the anterior acetabulum as well as around the superior and inferior neck.  I then removed a section of the femoral neck and a napkin ring fashion. Then used the power course to remove the femoral head from the acetabulum and thoroughly irrigated the acetabulum. I sized the femoral head.    I then exposed the deep acetabulum, cleared out any tissue including the ligamentum teres.   After adequate visualization, I excised the labrum, and then sequentially reamed.  I placed the trial acetabulum, which seated nicely, and then impacted the real cup into place.  Appropriate version and inclination was confirmed clinically matching their bony anatomy, and also with the use transverse acetabular ligament. I then placed the polyethylene liner in place  I then abducted the leg and released the external rotators from  the posterior femur allowing it to be easily delivered up lateral and anterior to the acetabulum for preparation of the femoral canal.    I then prepared the proximal femur using the cookie-cutter and then sequentially reamed and broached.  A trial broach, neck, and head was utilized, and I reduced the hip and it was found to have excellent stability with functional range of motion..  I then impacted  the real femoral prosthesis into place into the appropriate version, slightly anteverted to the normal anatomy, and I impacted the real head ball into place. The hip was then reduced and taken through functional range of motion and found to have excellent stability. Leg lengths were restored.  I then irrigated the hip copiously again with, and repaired the fascia with Vicryl, followed by monocryl for the subcutaneous tissue, Monocryl for the skin, Steri-Strips and sterile gauze. The wounds were injected. The patient was then awakened and returned to PACU in stable and satisfactory condition. There were no complications.  POST OPERATIVE PLAN: WBAT, DVT px: SCD's/TED and [id number]  [name], MD Orthopedic Surgeon [phone]   This note was generated using a template and dragon dictation system. In light of that, I have reviewed the note and all aspects of it are applicable to this case. Any dictation errors are due to the computerized dictation system.

## 2014-06-29 NOTE — Transfer of Care (Signed)
Immediate Anesthesia Transfer of Care Note  Patient: Frank [name]  Procedure(s) Performed: Procedure(s): LEFT TOTAL HIP ARTHROPLASTY ANTERIOR APPROACH (Left)  Patient Location: PACU  Anesthesia Type:MAC and Spinal  Level of Consciousness: awake, alert  and oriented  Airway & Oxygen Therapy: Patient Spontanous Breathing and Patient connected to nasal cannula oxygen  Post-op Assessment: Report given to PACU RN and Post -op Vital signs reviewed and stable  Post vital signs: Reviewed and stable  Complications: No apparent anesthesia complications

## 2014-06-29 NOTE — Progress Notes (Signed)
Dr t Eulah PontMurphy notified pt will have bed on 6n,he is "appreciativer of the head's up" and is ok with this

## 2014-06-29 NOTE — Anesthesia Preprocedure Evaluation (Addendum)
Anesthesia Evaluation  Patient identified by MRN, date of birth, ID band Patient awake    Reviewed: Allergy & Precautions, H&P , NPO status , Patient's Chart, lab work & pertinent test results  Airway Mallampati: III TM Distance: >3 FB Neck ROM: Full    Dental  (+) Teeth Intact, Dental Advisory Given   Pulmonary neg pulmonary ROS,  breath sounds clear to auscultation        Cardiovascular hypertension, Rhythm:Regular Rate:Normal     Neuro/Psych negative neurological ROS  negative psych ROS   GI/Hepatic negative GI ROS, Neg liver ROS,   Endo/Other  negative endocrine ROS  Renal/GU negative Renal ROS     Musculoskeletal  (+) Arthritis -,   Abdominal   Peds  Hematology negative hematology ROS (+)   Anesthesia Other Findings   Reproductive/Obstetrics                          Anesthesia Physical Anesthesia Plan  ASA: II  Anesthesia Plan: Spinal and MAC   Post-op Pain Management:    Induction: Intravenous  Airway Management Planned: Simple Face Mask and Natural Airway  Additional Equipment:   Intra-op Plan:   Post-operative Plan:   Informed Consent: I have reviewed the patients History and Physical, chart, labs and discussed the procedure including the risks, benefits and alternatives for the proposed anesthesia with the patient or authorized representative who has indicated his/her understanding and acceptance.   Dental advisory given  Plan Discussed with: CRNA  Anesthesia Plan Comments:        Anesthesia Quick Evaluation

## 2014-06-29 NOTE — Discharge Instructions (Signed)
Bear weight as tolerated  Take Aspirin and Meloxicam for 30 days post op

## 2014-06-29 NOTE — Anesthesia Procedure Notes (Signed)
Spinal  Patient location during procedure: OR Start time: 06/29/2014 11:25 AM End time: 06/29/2014 11:32 AM Staffing Anesthesiologist: Suzette Battiest E Performed by: anesthesiologist  Preanesthetic Checklist Completed: patient identified, site marked, surgical consent, pre-op evaluation, timeout performed, IV checked, risks and benefits discussed and monitors and equipment checked Spinal Block Patient position: sitting Prep: Betadine Patient monitoring: heart rate, continuous pulse ox, blood pressure and cardiac monitor Injection technique: single-shot Needle Needle type: [place]  Needle gauge: 22 G Needle length: 9 cm Assessment Sensory level: T6 Additional Notes Expiration date of kit checked and confirmed. Patient tolerated procedure well, without complications.

## 2014-06-29 NOTE — Anesthesia Postprocedure Evaluation (Signed)
  Anesthesia Post-op Note  Patient: Frank [name]  Procedure(s) Performed: Procedure(s): LEFT TOTAL HIP ARTHROPLASTY ANTERIOR APPROACH (Left)  Patient Location: PACU  Anesthesia Type:Spinal  Level of Consciousness: awake, alert  and oriented  Airway and Oxygen Therapy: Patient Spontanous Breathing  Post-op Pain: mild  Post-op Assessment: Post-op Vital signs reviewed  Post-op Vital Signs: Reviewed  Last Vitals:  Filed Vitals:   06/29/14 1531  BP:   Pulse: 54  Temp:   Resp: 14    Complications: No apparent anesthesia complications

## 2014-06-30 ENCOUNTER — Encounter (HOSPITAL_COMMUNITY): Payer: Self-pay | Admitting: Orthopedic Surgery

## 2014-06-30 LAB — CBC
HCT: 35.4 % — ABNORMAL LOW (ref 39.0–52.0)
Hemoglobin: 11.6 g/dL — ABNORMAL LOW (ref 13.0–17.0)
MCH: 28.6 pg (ref 26.0–34.0)
MCHC: 32.8 g/dL (ref 30.0–36.0)
MCV: 87.4 fL (ref 78.0–100.0)
Platelets: 142 10*3/uL — ABNORMAL LOW (ref 150–400)
RBC: 4.05 MIL/uL — ABNORMAL LOW (ref 4.22–5.81)
RDW: 13.7 % (ref 11.5–15.5)
WBC: 7.2 10*3/uL (ref 4.0–10.5)

## 2014-06-30 MED ORDER — TAMSULOSIN HCL 0.4 MG PO CAPS
0.4000 mg | ORAL_CAPSULE | Freq: Every day | ORAL | Status: DC
  Administered 2014-06-30 – 2014-07-02 (×3): 0.4 mg via ORAL
  Filled 2014-06-30 (×4): qty 1

## 2014-06-30 NOTE — Progress Notes (Signed)
Patient ID: Angelene GiovanniChristopher A Altschuler, male   DOB: 11/25/1950, 63 y.o.   MRN: [id number]     Subjective:  Patient reports pain as mild to moderate.  Patient states that he is doing well but having some pain.  Denies any CP or SOB.  Objective:   VITALS:   Filed Vitals:   06/30/14 0000 06/30/14 0136 06/30/14 0400 06/30/14 0503  BP:  138/84  153/91  Pulse:  70  84  Temp:  98 F (36.7 C)  99.3 F (37.4 C)  TempSrc:  Oral  Oral  Resp: 18 17 18 18   Height:      Weight:      SpO2: 95% 96% 95% 96%    ABD soft Sensation intact distally Dorsiflexion/Plantar flexion intact Incision: dressing C/D/I and no drainage   Lab Results  Component Value Date   WBC 7.2 06/30/2014   HGB 11.6* 06/30/2014   HCT 35.4* 06/30/2014   MCV 87.4 06/30/2014   PLT 142* 06/30/2014     Assessment/Plan: 1 Day Post-Op   Active Problems:   DJD (degenerative joint disease)   Advance diet Up with therapy [facility].  WBAT Dry dressing PRN    Haskel KhanDOUGLAS Idalis Hoelting 06/30/2014, 7:37 AM   Margarita Ranaimothy Murphy MD [phone]

## 2014-06-30 NOTE — Progress Notes (Signed)
I participated in the care of this patient and agree with the above history, physical and evaluation. I performed a review of the history and a physical exam as detailed   Zaelyn Noack Daniel Eligh Rybacki MD  

## 2014-06-30 NOTE — Evaluation (Signed)
Physical Therapy Evaluation Patient Details Name: Frank [name] MRN: [id number] DOB: 10/16/1950 Today's Date: 06/30/2014   History of Present Illness  63 y.o. s/p LEFT TOTAL HIP ARTHROPLASTY ANTERIOR APPROACH.  Clinical Impression  Pt presents with dependences in mobility secondary to THR. Pt would benefit from acute skilled PT to maximize mobility and Independence for return home with family assisting PRN. Recommend HHPT and a RW for home use.     Follow Up Recommendations Home health PT    Equipment Recommendations  Rolling walker with 5" wheels    Recommendations for Other Services       Precautions / Restrictions Precautions Precautions: Fall Precaution Comments: no hip precautions Restrictions Weight Bearing Restrictions: Yes LLE Weight Bearing: Weight bearing as tolerated      Mobility  Bed Mobility Overal bed mobility: Needs Assistance Bed Mobility: Supine to Sit;Sit to Supine     Supine to sit: Supervision Sit to supine: Min assist   General bed mobility comments: assistance to left LE up in bed and cues for technique to scoot up in the bed.  Transfers Overall transfer level: Needs assistance Equipment used: Rolling walker (2 wheeled) Transfers: Sit to/from Stand Sit to Stand: Min guard         General transfer comment: cues for hand placement and trunk flexion over base of support.  Ambulation/Gait Ambulation/Gait assistance: Supervision Ambulation Distance (Feet): 110 Feet Assistive device: Rolling walker (2 wheeled) Gait Pattern/deviations: Step-through pattern;Decreased weight shift to right   Gait velocity interpretation: Below normal speed for age/gender General Gait Details: cues for walker placement and step length for improved functional gait and independence.  Stairs            Wheelchair Mobility    Modified Rankin (Stroke Patients Only)       Balance Overall balance assessment: Needs assistance     Sitting balance -  Comments: pt had difficulty sitting EOB. Pt had a posteriorly lean and used Bil. UE to keep upright due to ROM limitations. Postural control: Posterior lean Standing balance support: Bilateral upper extremity supported Standing balance-Leahy Scale: Fair                               Pertinent Vitals/Pain Pain Assessment: 0-10 Pain Score: 9  Pain Location: left hip Pain Descriptors / Indicators: Aching Pain Intervention(s): Limited activity within patient's tolerance;Repositioned    Home Living Family/patient expects to be discharged to:: Private residence Living Arrangements: Parent Available Help at Discharge: Family;Available 24 hours/day Type of Home: House Home Access: Stairs to enter   Entergy CorporationEntrance Stairs-Number of Steps: 2 Home Layout: Two level Home Equipment: Cane - single point;Shower seat;Bedside commode      Prior Function Level of Independence: Independent with assistive device(s)         Comments: used cane     Hand Dominance        Extremity/Trunk Assessment   Upper Extremity Assessment: Defer to OT evaluation           Lower Extremity Assessment: LLE deficits/detail   LLE Deficits / Details: hip IR tightness, hamstring tightness bilaterally, knee 3/5 hip 2/5  Cervical / Trunk Assessment: Normal  Communication   Communication: No difficulties  Cognition Arousal/Alertness: Awake/alert Behavior During Therapy: WFL for tasks assessed/performed Overall Cognitive Status: Within Functional Limits for tasks assessed  General Comments      Exercises Total Joint Exercises Ankle Circles/Pumps: AROM;Both;10 reps;Supine;Strengthening Quad Sets: AROM;Strengthening;Both;10 reps;Supine Heel Slides: AAROM;Strengthening;Left;10 reps;Supine (therapist controlling hip rotation to neutral) Hip ABduction/ADduction: AAROM;Strengthening;Left;10 reps;Supine Long Arc Quad: AROM;Strengthening;Left;10 reps;Seated       Assessment/Plan    PT Assessment Patient needs continued PT services  PT Diagnosis Difficulty walking;Generalized weakness;Acute pain   PT Problem List Decreased strength;Decreased range of motion;Decreased activity tolerance;Decreased balance;Decreased mobility;Decreased knowledge of use of DME;Decreased safety awareness;Pain  PT Treatment Interventions DME instruction;Gait training;Stair training;Functional mobility training;Therapeutic activities;Therapeutic exercise;Balance training;Patient/family education   PT Goals (Current goals can be found in the Care Plan section) Acute Rehab PT Goals Patient Stated Goal: To return home PT Goal Formulation: With patient Time For Goal Achievement: 07/07/14 Potential to Achieve Goals: Good    Frequency 7X/week   Barriers to discharge        Co-evaluation               End of Session Equipment Utilized During Treatment: Gait belt Activity Tolerance: Patient tolerated treatment well Patient left: in bed;with call bell/phone within reach Nurse Communication: Mobility status         Time: [id number] PT Time Calculation (min): 35 min   Charges:   PT Evaluation $Initial PT Evaluation Tier I: 1 Procedure PT Treatments $Gait Training: 8-22 mins $Therapeutic Exercise: 8-22 mins   PT G Codes:          Greggory StallionWrisley, Kaija Kovacevic Kerstine 06/30/2014, 11:11 AM

## 2014-06-30 NOTE — Evaluation (Signed)
Occupational Therapy Evaluation Patient Details Name: Angelene GiovanniChristopher A Bonsignore MRN: [id number] DOB: 09/17/1950 Today's Date: 06/30/2014    History of Present Illness 63 y.o. s/p LEFT TOTAL HIP ARTHROPLASTY ANTERIOR APPROACH.   Clinical Impression   Pt s/p above. Pt independent with ADLs, PTA. Feel pt will benefit from acute OT to increase independence prior to d/c.    Follow Up Recommendations  Home health OT;Supervision - Intermittent    Equipment Recommendations  Other (comment) (AE)    Recommendations for Other Services       Precautions / Restrictions Precautions Precautions: Other (comment);Fall (no hip precautions-anterior hip) Restrictions Weight Bearing Restrictions: Yes LLE Weight Bearing: Weight bearing as tolerated      Mobility Bed Mobility Overal bed mobility: Needs Assistance Bed Mobility: Supine to Sit;Sit to Supine     Supine to sit: Supervision Sit to supine: Min assist   General bed mobility comments: assist with LLE. Told pt he could use blanket to assist LLE.  Transfers Overall transfer level: Needs assistance Equipment used: Rolling walker (2 wheeled) Transfers: Sit to/from Stand Sit to Stand: Min guard         General transfer comment: cues for technique and to control descent to chair.    Balance                                            ADL Overall ADL's : Needs assistance/impaired     Grooming: Wash/dry face;Oral care;Set up;Supervision/safety;Standing               Lower Body Dressing: Sit to/from stand;Minimal assistance;With adaptive equipment   Toilet Transfer: Minimal assistance;Min guard;Ambulation;RW (chair)           Functional mobility during ADLs: Min guard;Minimal assistance;Rolling walker General ADL Comments: Educated on tub transfer techniques-recommended not stepping over tub now. Educated on AE for LB ADLs and pt practiced with reacher and sockaid. Educated on dressing technique. Educated  on safety tips (safe shoewear, use of bag on walker, rugs, sitting for most of LB ADLs).      Vision                     Perception     Praxis      Pertinent Vitals/Pain Pain Assessment: 0-10 Pain Score: 10-Worst pain ever Pain Location: left hip Pain Descriptors / Indicators: Aching Pain Intervention(s): Monitored during session;Repositioned;Ice applied; nurse gave pain meds in session     Hand Dominance     Extremity/Trunk Assessment Upper Extremity Assessment Upper Extremity Assessment: Overall WFL for tasks assessed   Lower Extremity Assessment Lower Extremity Assessment: Defer to PT evaluation       Communication Communication Communication: No difficulties   Cognition Arousal/Alertness: Awake/alert Behavior During Therapy: WFL for tasks assessed/performed Overall Cognitive Status: Within Functional Limits for tasks assessed                     General Comments       Exercises       Shoulder Instructions      Home Living Family/patient expects to be discharged to:: Private residence Living Arrangements: Parent Available Help at Discharge: Family;Available 24 hours/day Type of Home: House       Home Layout: Two level Alternate Level Stairs-Number of Steps: 12 Alternate Level Stairs-Rails: Right Bathroom Shower/Tub: Chief Strategy OfficerTub/shower unit   Bathroom Toilet: Standard  Home Equipment: Gilmer MorCane - single point;Shower seat;Bedside commode          Prior Functioning/Environment Level of Independence: Independent with assistive device(s)        Comments: used cane    OT Diagnosis: Acute pain   OT Problem List: Decreased strength;Decreased range of motion;Decreased knowledge of use of DME or AE;Decreased knowledge of precautions;Decreased activity tolerance;Pain   OT Treatment/Interventions: Self-care/ADL training;DME and/or AE instruction;Therapeutic activities;Patient/family education;Balance training    OT Goals(Current goals can  be found in the care plan section) Acute Rehab OT Goals Patient Stated Goal: not stated OT Goal Formulation: With patient Time For Goal Achievement: 07/07/14 Potential to Achieve Goals: Good ADL Goals Pt Will Perform Lower Body Dressing: with modified independence;with adaptive equipment;sit to/from stand Pt Will Transfer to Toilet: with modified independence;ambulating (3 in 1 over toilet) Pt Will Perform Toileting - Clothing Manipulation and hygiene: with modified independence;sit to/from stand Pt Will Perform Tub/Shower Transfer: Tub transfer;with supervision;ambulating;shower seat;rolling walker Additional ADL Goal #1: Pt will perform bed mobility at Mod I level as precursor for ADLs.   OT Frequency: Min 2X/week   Barriers to D/C:            Co-evaluation              End of Session Equipment Utilized During Treatment: Gait belt;Rolling walker  Activity Tolerance: Patient tolerated treatment well Patient left: in bed;with call bell/phone within reach   Time: [id number] (nurse gave meds for a few minutes) OT Time Calculation (min): 29 min Charges:  OT General Charges $OT Visit: 1 Procedure OT Evaluation $Initial OT Evaluation Tier I: 1 Procedure OT Treatments $Self Care/Home Management : 8-22 mins G-CodesEarlie Raveling:    Aahna Rossa L OTR/L [phone] 06/30/2014, 10:20 AM

## 2014-06-30 NOTE — Progress Notes (Signed)
Pt voiding 100 cc urine hourly. Bladder scan shows post void residual of 245 cc. Encouraged pt to walk and be up in chair today. Will continue to monitor urinary output.

## 2014-06-30 NOTE — Care Management Note (Signed)
  Page 1 of 1   06/30/2014     2:31:47 PM CARE MANAGEMENT NOTE 06/30/2014  Patient:  Frank [name],Frank [name]   Account Number:  [id number]  Date Initiated:  06/30/2014  Documentation initiated by:  Ronny FlurryWILE,Azelea Seguin  Subjective/Objective Assessment:     Action/Plan:   Anticipated DC Date:     Anticipated DC Plan:  HOME W HOME HEALTH SERVICES         Choice offered to / List presented to:  C-1 Patient   DME arranged  3-N-1  Levan HurstWALKER - ROLLING      DME agency  [facility]     HH arranged  HH-2 PT  HH[facility]3 OT      [facility]   Status of service:  Completed, signed off Medicare Important Message given?   (If response is "NO", the following Medicare IM given date fields will be blank) Date Medicare IM given:   Medicare IM given by:   Date Additional Medicare IM given:   Additional Medicare IM given by:    Discharge Disposition:  HOME W HOME HEALTH SERVICES  Per UR Regulation:  Reviewed for med. necessity/level of care/duration of stay  If discussed at Long Length of Stay Meetings, dates discussed:    Comments:  06-30-14 Confirmed face sheet information with patient . Patient requesting 3 in 1.  3 in 1 and walker ordered. Ronny FlurryHeather Soledad Budreau RN BSN  [phone]

## 2014-07-01 LAB — CBC
HCT: 33 % — ABNORMAL LOW (ref 39.0–52.0)
Hemoglobin: 10.8 g/dL — ABNORMAL LOW (ref 13.0–17.0)
MCH: 27.6 pg (ref 26.0–34.0)
MCHC: 32.7 g/dL (ref 30.0–36.0)
MCV: 84.4 fL (ref 78.0–100.0)
Platelets: 157 10*3/uL (ref 150–400)
RBC: 3.91 MIL/uL — ABNORMAL LOW (ref 4.22–5.81)
RDW: 13.7 % (ref 11.5–15.5)
WBC: 13.5 10*3/uL — ABNORMAL HIGH (ref 4.0–10.5)

## 2014-07-01 NOTE — Progress Notes (Signed)
Occupational Therapy Treatment Patient Details Name: Angelene GiovanniChristopher A Jasso MRN: [id number] DOB: 09/13/1950 Today's Date: 07/01/2014    History of present illness [age] y.o. s/p LEFT TOTAL HIP ARTHROPLASTY ANTERIOR APPROACH.   OT comments  Pt seen today for functional mobility and ADL training. Pt progressing with functional mobility and education provided for compensatory techniques for LB ADLs with pt and his mother. Pt will continue to benefit from acute OT to address LB ADLs and functional mobility.    Follow Up Recommendations  Home health OT;Supervision - Intermittent    Equipment Recommendations  3 in 1 bedside comode    Recommendations for Other Services      Precautions / Restrictions Precautions Precautions: Fall Precaution Comments: no hip precautions Restrictions Weight Bearing Restrictions: Yes LLE Weight Bearing: Weight bearing as tolerated       Mobility Bed Mobility               General bed mobility comments: pt sitting up in recliner.   Transfers Overall transfer level: Modified independent Equipment used: Rolling walker (2 wheeled) Transfers: Sit to/from Stand Sit to Stand: Supervision Stand pivot transfers: Supervision       General transfer comment: No VC's needed. Pt with good hand placement and controlled ascent/descent.     Balance Overall balance assessment: Modified Independent       Postural control: Posterior lean Standing balance support: Bilateral upper extremity supported Standing balance-Leahy Scale: Fair                     ADL Overall ADL's : Needs assistance/impaired     Grooming: Supervision/safety;Standing                   Toilet Transfer: Supervision/safety;Ambulation;RW;BSC           Functional mobility during ADLs: Supervision/safety;Rolling walker General ADL Comments: Pt progressing with functional mobility and provided education on compensatory techniques for LB ADLs. Encouraged pt to  gradually increase reach to L foot within limits of pain to promote independence with ADLs through flexibility as pt heals. Discussed safe tub transfer and waiting a few days until mobility is improved. Addressed pt and mother questions regarding safety with ADLs.                 Cognition  Arousal/Alertness: Awake/Alert Behavior During Therapy: WFL for tasks assessed/performed Overall Cognitive Status: Within Functional Limits for tasks assessed                                    Pertinent Vitals/ Pain       Pain Assessment: No/denies pain ("tender")          Frequency Min 2X/week     Progress Toward Goals  OT Goals(current goals can now be found in the care plan section)  Progress towards OT goals: Progressing toward goals  Acute Rehab OT Goals Patient Stated Goal: planning home tomorrow  Plan Discharge plan remains appropriate       End of Session Equipment Utilized During Treatment: Gait belt;Rolling walker   Activity Tolerance Patient tolerated treatment well   Patient Left in chair;with call bell/phone within reach;with family/visitor present   Nurse Communication          Time: [id number] OT Time Calculation (min): 13 min  Charges: OT General Charges $OT Visit: 1 Procedure OT Treatments $Therapeutic Activity: 8-22 mins  Nena JordanMiller, Cameron Katayama M 07/01/2014, 4:33 PM  Cyndie Chime, OTR/L Occupational Therapist [phone] (pager)

## 2014-07-01 NOTE — Progress Notes (Signed)
Patient ID: Frank [name], male   DOB: 09/14/1950, 63 y.o.   MRN: [id number]     Subjective:  Patient reports pain as mild to moderate.  Patient improving denies any CP or SOB.  Doing well with PT  Objective:   VITALS:   Filed Vitals:   07/01/14 0800 07/01/14 0816 07/01/14 1005 07/01/14 1300  BP: 110/60  117/58 104/67  Pulse: 64 62 69 76  Temp: 97.5 F (36.4 C)  97.3 F (36.3 C) 97.9 F (36.6 C)  TempSrc: Oral  Oral Oral  Resp: 18  18 18   Height:      Weight:      SpO2:   96% 96%    ABD soft Sensation intact distally Dorsiflexion/Plantar flexion intact Incision: dressing C/D/I and no drainage   Lab Results  Component Value Date   WBC 13.5* 07/01/2014   HGB 10.8* 07/01/2014   HCT 33.0* 07/01/2014   MCV 84.4 07/01/2014   PLT 157 07/01/2014     Assessment/Plan: 2 Days Post-Op   Active Problems:   DJD (degenerative joint disease)   Advance diet Up with therapy [facility] Dry dressing prn    Haskel KhanDOUGLAS Jaishaun Mcnab 07/01/2014, 4:07 PM   Margarita Ranaimothy Murphy MD [phone]

## 2014-07-01 NOTE — Progress Notes (Signed)
Physical Therapy Treatment Patient Details Name: Frank [name] MRN: [id number] DOB: 11/03/1950 Today's Date: 07/01/2014    History of Present Illness 63 y.o. s/p LEFT TOTAL HIP ARTHROPLASTY ANTERIOR APPROACH.    PT Comments    Pt was seen for further therapy after having his morning session and is in some discomfort.  Will continue in AM before discharge home with wife, planning HHPT and pt to be supervised by wife at home.  Follow Up Recommendations  Home health PT     Equipment Recommendations  Rolling walker with 5" wheels    Recommendations for Other Services       Precautions / Restrictions Precautions Precautions: Fall Precaution Comments: no hip precautions Restrictions Weight Bearing Restrictions: Yes LLE Weight Bearing: Weight bearing as tolerated    Mobility  Bed Mobility                  Transfers Overall transfer level: Needs assistance Equipment used: Rolling walker (2 wheeled) Transfers: Sit to/from UGI CorporationStand;Stand Pivot Transfers (stand to sit) Sit to Stand: Supervision Stand pivot transfers: Supervision       General transfer comment: forgot hand placement to sit, and reminded him as he is not always controlling descent properly  Ambulation/Gait Ambulation/Gait assistance: Supervision Ambulation Distance (Feet): 225 Feet Assistive device: Rolling walker (2 wheeled) Gait Pattern/deviations: Step-through pattern;Trunk flexed;Wide base of support;Antalgic;Decreased stance time - left;Decreased weight shift to left Gait velocity: slow Gait velocity interpretation: Below normal speed for age/gender General Gait Details: Reminded him about upright posture and  step length on LLE   Stairs            Wheelchair Mobility    Modified Rankin (Stroke Patients Only)       Balance Overall balance assessment: Modified Independent       Postural control: Posterior lean Standing balance support: Bilateral upper extremity  supported Standing balance-Leahy Scale: Fair                      Cognition Arousal/Alertness: Awake/alert Behavior During Therapy: WFL for tasks assessed/performed Overall Cognitive Status: Within Functional Limits for tasks assessed                      Exercises Total Joint Exercises Ankle Circles/Pumps: AROM;Both;5 reps Quad Sets: AROM;Strengthening;Both;10 reps;Supine Gluteal Sets: AROM;Both;10 reps Heel Slides: AROM;Both;10 reps Hip ABduction/ADduction: AROM;Both;10 reps    General Comments General comments (skin integrity, edema, etc.): Pt was having more pain today as the day went on.  Applied ice this pm to decrease his discomfort      Pertinent Vitals/Pain Pain Assessment: 0-10 Pain Score: 7  Pain Location: left hip Pain Intervention(s): Limited activity within patient's tolerance;Monitored during session;Repositioned;Ice applied    Home Living                      Prior Function            PT Goals (current goals can now be found in the care plan section) Acute Rehab PT Goals Patient Stated Goal: planning home tomorrow Progress towards PT goals: Progressing toward goals    Frequency  7X/week    PT Plan Current plan remains appropriate    Co-evaluation             End of Session Equipment Utilized During Treatment: Gait belt Activity Tolerance: Patient tolerated treatment well Patient left: in chair;with call bell/phone within reach     Time: 1415-1440 PT  Time Calculation (min): 25 min  Charges:  $Gait Training: 8-22 mins $Therapeutic Exercise: 8-22 mins                    G Codes:      Frank [name], Frank [name] 07/01/2014, 3:05 PM Frank [name], PT MS Acute Rehab Dept. Number: [phone]

## 2014-07-01 NOTE — Progress Notes (Signed)
Physical Therapy Treatment Patient Details Name: Frank GiovanniChristopher A Spoelstra MRN: [id number] DOB: 08/27/1951 Today's Date: 07/01/2014    History of Present Illness 63 y.o. s/p LEFT TOTAL HIP ARTHROPLASTY ANTERIOR APPROACH.    PT Comments    Pt was seen for visit to attempt stairs with good effort and demonstration of verbal cues by PT.  Has safe technique with only one rail as home, will be able to be supervised to get into the house.  Follow Up Recommendations  Home health PT     Equipment Recommendations  Rolling walker with 5" wheels    Recommendations for Other Services       Precautions / Restrictions Precautions Precautions: Fall Precaution Comments: no hip precautions Restrictions Weight Bearing Restrictions: Yes LLE Weight Bearing: Weight bearing as tolerated    Mobility  Bed Mobility                  Transfers Overall transfer level: Needs assistance Equipment used: Rolling walker (2 wheeled) Transfers: Sit to/from Stand;Stand Pivot Transfers Sit to Stand: Supervision Stand pivot transfers: Supervision       General transfer comment: observation for safety and reminded on initial standing at stairs  Ambulation/Gait Ambulation/Gait assistance: Supervision Ambulation Distance (Feet): 350 Feet Assistive device: Rolling walker (2 wheeled) Gait Pattern/deviations: Step-through pattern;Decreased stance time - left;Decreased dorsiflexion - left;Antalgic (as he fatigues he becomes antalgic) Gait velocity: reduced slightly Gait velocity interpretation: Below normal speed for age/gender     Stairs Stairs: Yes Stairs assistance: Min guard Stair Management: One rail Right;Alternating pattern;Step to pattern Number of Stairs: 20 General stair comments: used R rail ascending for both up and down to replicate home  Wheelchair Mobility    Modified Rankin (Stroke Patients Only)       Balance Overall balance assessment: Modified Independent     Sitting  balance - Comments: I sitting in chair Postural control: Posterior lean Standing balance support: Bilateral upper extremity supported Standing balance-Leahy Scale: Fair                      Cognition Arousal/Alertness: Awake/alert Behavior During Therapy: WFL for tasks assessed/performed Overall Cognitive Status: Within Functional Limits for tasks assessed                      Exercises      General Comments General comments (skin integrity, edema, etc.): Stairs were needed for going home and pt successfully could manage.  His plan is to let wife supervise him in the house      Pertinent Vitals/Pain Pain Assessment: 0-10 Pain Score: 6  Pain Descriptors / Indicators: Aching Pain Intervention(s): Limited activity within patient's tolerance;Monitored during session;Premedicated before session;Repositioned BP was 117/58, pulse 69 and O2 sat 96% per nsg notes.    Home Living                      Prior Function            PT Goals (current goals can now be found in the care plan section) Acute Rehab PT Goals Patient Stated Goal: to get home and get up stairs Progress towards PT goals: Progressing toward goals    Frequency  7X/week    PT Plan Current plan remains appropriate    Co-evaluation             End of Session Equipment Utilized During Treatment: Gait belt Activity Tolerance: Patient tolerated treatment well Patient left: in  chair;with call bell/phone within reach     Time: 0956-1028 PT Time Calculation (min): 32 min  Charges:  $Gait Training: 23-37 mins                    G Codes:      Ivar DrapeStout, Tatumn Corbridge E 07/01/2014, 10:37 AM Samul Dadauth Sten Dematteo, PT MS Acute Rehab Dept. Number: [phone]

## 2014-07-01 NOTE — Progress Notes (Signed)
I participated in the care of this patient and agree with the above history, physical and evaluation. I performed a review of the history and a physical exam as detailed   Che Rachal Daniel Evanny Ellerbe MD  

## 2014-07-02 LAB — CBC
HCT: 31.6 % — ABNORMAL LOW (ref 39.0–52.0)
Hemoglobin: 10.6 g/dL — ABNORMAL LOW (ref 13.0–17.0)
MCH: 28.3 pg (ref 26.0–34.0)
MCHC: 33.5 g/dL (ref 30.0–36.0)
MCV: 84.5 fL (ref 78.0–100.0)
PLATELETS: 158 10*3/uL (ref 150–400)
RBC: 3.74 MIL/uL — ABNORMAL LOW (ref 4.22–5.81)
RDW: 13.7 % (ref 11.5–15.5)
WBC: 13 10*3/uL — AB (ref 4.0–10.5)

## 2014-07-02 NOTE — Care Management Note (Signed)
07-02-14 Spoke with patient and PT at  bedside and patient's mother by phone , regarding discharge plan . Everyone in agreement for patient to go home with [facility] health ( [facility] and 3 n 1 and walker ( ordered through [facility] ) . MD aware.   Ronny FlurryHeather Kemberly Taves RN BSN [phone]

## 2014-07-02 NOTE — Progress Notes (Signed)
Physical Therapy Treatment Patient Details Name: Frank [name] MRN: [id number] DOB: 06/12/1951 Today's Date: 07/02/2014    History of Present Illness      PT Comments    Plan is for d/c home today with HHPT.  Follow Up Recommendations  Home health PT;Supervision - Intermittent     Equipment Recommendations  Rolling walker with 5" wheels;3in1 (PT)    Recommendations for Other Services       Precautions / Restrictions Precautions Precautions: Fall Precaution Comments: no hip precautions Restrictions Weight Bearing Restrictions: Yes LLE Weight Bearing: Weight bearing as tolerated    Mobility  Bed Mobility         Supine to sit: Modified independent (Device/Increase time) Sit to supine: Modified independent (Device/Increase time)      Transfers Overall transfer level: Modified independent Equipment used: Rolling walker (2 wheeled)   Sit to Stand: Modified independent (Device/Increase time) Stand pivot transfers: Modified independent (Device/Increase time)       General transfer comment: No VC's needed. Pt with good hand placement and controlled ascent/descent.   Ambulation/Gait Ambulation/Gait assistance: Modified independent (Device/Increase time);Supervision Ambulation Distance (Feet): 350 Feet Assistive device: Rolling walker (2 wheeled) Gait Pattern/deviations: Step-through pattern;Decreased stride length Gait velocity: WFL       Stairs            Wheelchair Mobility    Modified Rankin (Stroke Patients Only)       Balance                                    Cognition Arousal/Alertness: Awake/alert Behavior During Therapy: WFL for tasks assessed/performed Overall Cognitive Status: Within Functional Limits for tasks assessed                      Exercises Total Joint Exercises Ankle Circles/Pumps: AROM;Both;10 reps Heel Slides: AROM;Left;10 reps Hip ABduction/ADduction: AROM;Right;Left;10 reps Straight  Leg Raises: AAROM;Left;5 reps    General Comments        Pertinent Vitals/Pain Pain Assessment: 0-10 Pain Score: 5  Pain Location: L hip Pain Intervention(s): Monitored during session;Repositioned    Home Living                      Prior Function            PT Goals (current goals can now be found in the care plan section) Progress towards PT goals: Progressing toward goals    Frequency  7X/week    PT Plan Current plan remains appropriate    Co-evaluation             End of Session Equipment Utilized During Treatment: Gait belt Activity Tolerance: Patient tolerated treatment well Patient left: in chair;with call bell/phone within reach     Time: 1105-1117 PT Time Calculation (min): 12 min  Charges:  $Gait Training: 8-22 mins                    G Codes:      Frank [name], Frank [name] 07/02/2014, 11:28 AM

## 2014-07-02 NOTE — Progress Notes (Signed)
I participated in the care of this patient and agree with the above history, physical and evaluation. I performed a review of the history and a physical exam as detailed   Anaiz Qazi Daniel Corrinna Karapetyan MD  

## 2014-07-02 NOTE — Progress Notes (Signed)
Patient ID: Frank [name], male   DOB: 08/27/1951, 63 y.o.   MRN: [id number]     Subjective:  Patient reports pain as mild.  Patient doing better he states that he would like to go to SNF For more rehab.  Objective:   VITALS:   Filed Vitals:   07/01/14 2210 07/02/14 0000 07/02/14 0400 07/02/14 0508  BP: 133/76   136/101  Pulse: 98   88  Temp: 97.4 F (36.3 C)   98.8 F (37.1 C)  TempSrc: Oral   Oral  Resp: 20 16 18 18   Height:      Weight:      SpO2: 98% 96% 97% 96%    ABD soft Sensation intact distally Dorsiflexion/Plantar flexion intact Incision: dressing C/D/I and no drainage   Lab Results  Component Value Date   WBC 13.0* 07/02/2014   HGB 10.6* 07/02/2014   HCT 31.6* 07/02/2014   MCV 84.5 07/02/2014   PLT 158 07/02/2014     Assessment/Plan: 3 Days Post-Op   Active Problems:   DJD (degenerative joint disease)   Advance diet Up with therapy [name] Dry dressing PRN    Haskel KhanDOUGLAS Jashua Knaak 07/02/2014, 7:10 AM   Margarita Ranaimothy Murphy MD [phone]

## 2014-07-02 NOTE — Discharge Summary (Signed)
Physician Discharge Summary  Patient ID: Angelene GiovanniChristopher A Brigante MRN: [id number] DOB/AGE: 63/04/1951 63 y.o.  Admit date: 06/29/2014 Discharge date: 07/02/2014  Admission Diagnoses:  <principal problem not specified>  Discharge Diagnoses:  Active Problems:   DJD (degenerative joint disease)   Past Medical History  Diagnosis Date  . Hypertension   . Dental abscess 08/20/2012    "right lower jaw" (08/20/2012)  . Arthritis     hip- left    Surgeries: Procedure(s): LEFT TOTAL HIP ARTHROPLASTY ANTERIOR APPROACH on 06/29/2014   Consultants (if any):    Discharged Condition: Improved  Hospital Course: Angelene GiovanniChristopher A Ludwick is an 63 y.o. male who was admitted 06/29/2014 with a diagnosis of <principal problem not specified> and went to the operating room on 06/29/2014 and underwent the above named procedures.    He was given perioperative antibiotics:  Anti-infectives   Start     Dose/Rate Route Frequency Ordered Stop   06/29/14 1900  ceFAZolin (ANCEF) IVPB 2 g/50 mL premix     2 g 100 mL/hr over 30 Minutes Intravenous Every 6 hours 06/29/14 1635 06/30/14 0055   06/29/14 0600  ceFAZolin (ANCEF) IVPB 2 g/50 mL premix     2 g 100 mL/hr over 30 Minutes Intravenous On call to O.R. 06/28/14 1337 06/29/14 1153    .  He was given sequential compression devices, early ambulation, and ASA 325 for DVT prophylaxis.  He benefited maximally from the hospital stay and there were no complications.    Recent vital signs:  Filed Vitals:   07/02/14 0914  BP: 128/87  Pulse: 110  Temp:   Resp:     Recent laboratory studies:  Lab Results  Component Value Date   HGB 10.6* 07/02/2014   HGB 10.8* 07/01/2014   HGB 11.6* 06/30/2014   Lab Results  Component Value Date   WBC 13.0* 07/02/2014   PLT 158 07/02/2014   Lab Results  Component Value Date   INR 1.00 06/21/2014   Lab Results  Component Value Date   NA 139 06/21/2014   K 3.9 06/21/2014   CL 98 06/21/2014   CO2 28 06/21/2014    BUN 15 06/21/2014   CREATININE 1.02 06/21/2014   GLUCOSE 101* 06/21/2014    Discharge Medications:     Medication List         aspirin EC 325 MG tablet  Take 1 tablet (325 mg total) by mouth daily.     diltiazem 240 MG 24 hr capsule  Commonly known as:  TIAZAC  Take 240 mg by mouth daily before breakfast.     docusate sodium 100 MG capsule  Commonly known as:  COLACE  Take 1 capsule (100 mg total) by mouth 2 (two) times daily. Continue this while taking narcotics to help with bowel movements     HYDROcodone-acetaminophen 5-325 MG per tablet  Commonly known as:  NORCO  Take 1-2 tablets by mouth every 4 (four) hours as needed for moderate pain.     losartan-hydrochlorothiazide 100-25 MG per tablet  Commonly known as:  HYZAAR  Take 1 tablet by mouth daily before breakfast.     meloxicam 15 MG tablet  Commonly known as:  MOBIC  Take 1 tablet (15 mg total) by mouth daily.     metoprolol succinate 100 MG 24 hr tablet  Commonly known as:  TOPROL-XL  Take 100 mg by mouth daily before breakfast. Take with or immediately following a meal.        Diagnostic Studies: Dg Chest  2 View  06/21/2014   CLINICAL DATA:  Preoperative hip replacement  EXAM: CHEST  2 VIEW  COMPARISON:  October 15, 2013  FINDINGS: There is a degree of underlying emphysematous change. There is no edema or consolidation. The heart size and pulmonary vascularity are within normal limits. No adenopathy. Aorta is mildly tortuous. There is slight superior endplate concavity at several mid thoracic levels. There is degenerative change in the thoracic spine.  IMPRESSION: No edema or consolidation. Evidence of a degree of underlying emphysematous change.   Electronically Signed   By: Bretta BangWilliam  Woodruff M.D.   On: 06/21/2014 14:02   Dg Pelvis Portable  06/29/2014   CLINICAL DATA:  Postop left hip replacement.  EXAM: PORTABLE PELVIS 1-2 VIEWS  COMPARISON:  10/15/2013  FINDINGS: Patient now has a left hip arthroplasty.  No evidence for a periprosthetic fracture. Pelvic bony ring is intact. Mild-to-moderate joint space narrowing in the medial right hip joint.  IMPRESSION: Left hip arthroplasty.   Electronically Signed   By: Richarda OverlieAdam  Henn M.D.   On: 06/29/2014 14:54    Disposition: 01-Home or Self Care      Discharge Instructions   Weight bearing as tolerated    Complete by:  As directed            Follow-up Information   Follow up with Sheral ApleyMURPHY, Keilah Lemire, D, MD. (as scheduled)    Specialty:  Orthopedic Surgery   Contact information:   [address] [place] [id number] [phone]        Signed: Margarita RanaMURPHY, Lugenia Assefa, D 07/02/2014, 11:19 AM

## 2015-01-21 DIAGNOSIS — Z9889 Other specified postprocedural states: Secondary | ICD-10-CM | POA: Insufficient documentation

## 2015-02-24 ENCOUNTER — Encounter: Payer: Self-pay | Admitting: Internal Medicine

## 2015-02-28 ENCOUNTER — Ambulatory Visit (AMBULATORY_SURGERY_CENTER): Payer: Self-pay | Admitting: *Deleted

## 2015-02-28 VITALS — Ht 66.0 in | Wt 176.8 lb

## 2015-02-28 DIAGNOSIS — Z1211 Encounter for screening for malignant neoplasm of colon: Secondary | ICD-10-CM

## 2015-02-28 NOTE — Progress Notes (Signed)
No egg or soy allergy No issues with past sedation No home 02 No diet pills emmi video declined Mother in PV with pt. Pt was able to answer questions but mother seemed to aide patient in consent process and instructions. Nothing documented in chart about any issues with patients memory or chronic long term issues but mother seemed like she was in charge medically of patient in PV today.

## 2015-03-09 ENCOUNTER — Ambulatory Visit (AMBULATORY_SURGERY_CENTER): Payer: Medicare HMO | Admitting: Internal Medicine

## 2015-03-09 ENCOUNTER — Encounter: Payer: Self-pay | Admitting: Internal Medicine

## 2015-03-09 VITALS — BP 123/68 | HR 71 | Temp 97.2°F | Resp 18 | Ht 66.0 in | Wt 176.0 lb

## 2015-03-09 DIAGNOSIS — Z1211 Encounter for screening for malignant neoplasm of colon: Secondary | ICD-10-CM | POA: Diagnosis not present

## 2015-03-09 DIAGNOSIS — K5730 Diverticulosis of large intestine without perforation or abscess without bleeding: Secondary | ICD-10-CM | POA: Diagnosis not present

## 2015-03-09 MED ORDER — SODIUM CHLORIDE 0.9 % IV SOLN: 500.0000 mL | INTRAVENOUS | Status: DC

## 2015-03-09 NOTE — Progress Notes (Signed)
Stable to RR 

## 2015-03-09 NOTE — Patient Instructions (Addendum)
No polyps or cancer seen! You also have a condition called diverticulosis - common and not usually a problem. Please read the handout provided.  Next routine colonoscopy in 10 years - 2026  I appreciate the opportunity to care for you. Iva Booparl E. Moishy Laday, MD, FACG   YOU HAD AN ENDOSCOPIC PROCEDURE TODAY AT THE [facility]:   Refer to the procedure report that was given to you for any specific questions about what was found during the examination.  If the procedure report does not answer your questions, please call your gastroenterologist to clarify.  If you requested that your care partner not be given the details of your procedure findings, then the procedure report has been included in a sealed envelope for you to review at your convenience later.  YOU SHOULD EXPECT: Some feelings of bloating in the abdomen. Passage of more gas than usual.  Walking can help get rid of the air that was put into your GI tract during the procedure and reduce the bloating. If you had a lower endoscopy (such as a colonoscopy or flexible sigmoidoscopy) you may notice spotting of blood in your stool or on the toilet paper. If you underwent a bowel prep for your procedure, you may not have a normal bowel movement for a few days.  Please Note:  You might notice some irritation and congestion in your nose or some drainage.  This is from the oxygen used during your procedure.  There is no need for concern and it should clear up in a day or so.  SYMPTOMS TO REPORT IMMEDIATELY:   Following lower endoscopy (colonoscopy or flexible sigmoidoscopy):  Excessive amounts of blood in the stool  Significant tenderness or worsening of abdominal pains  Swelling of the abdomen that is new, acute  Fever of 100F or higher  For urgent or emergent issues, a gastroenterologist can be reached at any hour by calling [phone].   DIET: Your first meal following the procedure should be a small meal and then it is  ok to progress to your normal diet. Heavy or fried foods are harder to digest and may make you feel nauseous or bloated.  Likewise, meals heavy in dairy and vegetables can increase bloating.  Drink plenty of fluids but you should avoid alcoholic beverages for 24 hours.  ACTIVITY:  You should plan to take it easy for the rest of today and you should NOT DRIVE or use heavy machinery until tomorrow (because of the sedation medicines used during the test).    FOLLOW UP: Our staff will call the number listed on your records the next business day following your procedure to check on you and address any questions or concerns that you may have regarding the information given to you following your procedure. If we do not reach you, we will leave a message.  However, if you are feeling well and you are not experiencing any problems, there is no need to return our call.  We will assume that you have returned to your regular daily activities without incident.  If any biopsies were taken you will be contacted by phone or by letter within the next 1-3 weeks.  Please call us at [phone] if you have not heard about the biopsies in 3 weeks.    SIGNATURES/CONFIDENTIALITY: You and/or your care partner have signed paperwork which will be entered into your electronic medical record.  These signatures attest to the fact that that the information above on your After  Visit Summary has been reviewed and is understood.  Full responsibility of the confidentiality of this discharge information lies with you and/or your care-partner. 

## 2015-03-09 NOTE — Op Note (Signed)
[facility] [address] [place], [id number]   COLONOSCOPY PROCEDURE REPORT  PATIENT: Frank [name], Frank [name]  MR#: [id number] BIRTHDATE: 19-Mar-1951 , 64  yrs. old GENDER: male ENDOSCOPIST: Iva Booparl E Gessner, MD, [facility] PROCEDURE DATE:  03/09/2015 PROCEDURE:   Colonoscopy, screening First Screening Colonoscopy - Avg.  risk and is 50 yrs.  old or older Yes.  Prior Negative Screening - Now for repeat screening. 10 or more years since last screening  History of Adenoma - Now for follow-up colonoscopy & has been > or = to 3 yrs.  [name]  Polyps removed today? No Recommend repeat exam, <10 yrs? No ASA CLASS:   Class II INDICATIONS:Screening for colonic neoplasia and Colorectal Neoplasm Risk Assessment for this procedure is average risk. MEDICATIONS: Propofol 200 mg IV, Monitored anesthesia care, and Lidocaine 40 mg IV  DESCRIPTION OF PROCEDURE:   After the risks benefits and alternatives of the procedure were thoroughly explained, informed consent was obtained.  The digital rectal exam revealed no abnormalities of the rectum, revealed the prostate was not enlarged, and revealed no prostatic nodules.   The LB [id number]  endoscope was introduced through the anus and advanced to the cecum, which was identified by both the appendix and ileocecal valve. No adverse events experienced.   The quality of the prep was excellent.  (MiraLax was used)  The instrument was then slowly withdrawn as the colon was fully examined. Estimated blood loss is zero unless otherwise noted in this procedure report.      COLON FINDINGS: There was moderate diverticulosis noted in the sigmoid colon.   The examination was otherwise normal.  Retroflexed views revealed no abnormalities. The time to cecum = 1.8 Withdrawal time = 13.4   The scope was withdrawn and the procedure completed. COMPLICATIONS: There were no immediate complications.  ENDOSCOPIC IMPRESSION: 1.   Moderate diverticulosis was  noted in the sigmoid colon 2.   The examination was otherwise normal - excellent prep  RECOMMENDATIONS: Repeat colonoscopy 10 years - 2026  eSigned:  Iva Booparl E Gessner, MD, [facility] 03/09/2015 10:29 AM   cc: Zoe LanPenny Jones, NP and The Patient

## 2015-03-10 ENCOUNTER — Telehealth: Payer: Self-pay | Admitting: Emergency Medicine

## 2015-03-10 NOTE — Telephone Encounter (Signed)
  Follow up Call-  Call back number 03/09/2015  Post procedure Call Back phone  # [phone]  Permission to leave phone message Yes     Patient questions:  Do you have a fever, pain , or abdominal swelling? No. Pain Score  0 *  Have you tolerated food without any problems? Yes.    Have you been able to return to your normal activities? Yes.    Do you have any questions about your discharge instructions: Diet   No. Medications  No. Follow up visit  No.  Do you have questions or concerns about your Care? No.  Actions: * If pain score is 4 or above: No action needed, pain <4.

## 2016-04-23 DIAGNOSIS — H900 Conductive hearing loss, bilateral: Secondary | ICD-10-CM | POA: Insufficient documentation

## 2018-02-26 ENCOUNTER — Emergency Department (HOSPITAL_COMMUNITY): Payer: Medicare HMO

## 2018-02-26 ENCOUNTER — Other Ambulatory Visit: Payer: Self-pay

## 2018-02-26 ENCOUNTER — Emergency Department (HOSPITAL_COMMUNITY)
Admission: EM | Admit: 2018-02-26 | Discharge: 2018-02-26 | Disposition: A | Discharge status: Home / Self Care | Payer: Medicare HMO | Attending: Emergency Medicine | Admitting: Emergency Medicine

## 2018-02-26 ENCOUNTER — Encounter (HOSPITAL_COMMUNITY): Payer: Self-pay

## 2018-02-26 DIAGNOSIS — I10 Essential (primary) hypertension: Secondary | ICD-10-CM | POA: Diagnosis not present

## 2018-02-26 DIAGNOSIS — Z96642 Presence of left artificial hip joint: Secondary | ICD-10-CM | POA: Diagnosis not present

## 2018-02-26 DIAGNOSIS — Z79899 Other long term (current) drug therapy: Secondary | ICD-10-CM | POA: Insufficient documentation

## 2018-02-26 DIAGNOSIS — M247 Protrusio acetabuli: Secondary | ICD-10-CM | POA: Diagnosis not present

## 2018-02-26 DIAGNOSIS — M25552 Pain in left hip: Secondary | ICD-10-CM | POA: Diagnosis present

## 2018-02-26 MED ORDER — PREDNISONE 10 MG (21) PO TBPK: ORAL_TABLET | Freq: Every day | ORAL | 0 refills | Status: AC

## 2018-02-26 MED ORDER — HYDROCODONE-ACETAMINOPHEN 5-325 MG PO TABS: 1.0000 | ORAL_TABLET | Freq: Four times a day (QID) | ORAL | 0 refills | Status: AC | PRN

## 2018-02-26 NOTE — ED Provider Notes (Signed)
Received patient at signout from PA Caccavale.  Refer to provider note for full history and physical examination.  Briefly, patient is a 67 year old male with history of hypertension status post left hip replacement with Dr. Eulah PontMurphy 2015 presents with acute onset left hip and anterior thigh pain for 3 days.  Pain is atraumatic, gradually worsening.  He is neurovascularly intact.  He is able to ambulate with the assistance of a cane or walker which is not his baseline.  No concern for sepsis.  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  Awaiting orthopedic consultation.  MDM   6:12 PM Spoke with Dr. Thurston HoleWainer who states that patient is stable for discharge home and follow-up with Dr. Eulah PontMurphy in the office on an outpatient basis on Friday.  He does recommend sending patient home with pain medicine.  On reevaluation patient is resting comfortably, able to pull himself up to a standing position and ambulates with some assistance.  Will discharge with steroid taper and small amount of hydrocodone for pain control.  Discussed strict ED return precautions.  Patient and patient's mother verbalized understanding of and agreement with plan and patient is stable for discharge home at this time.        Jeanie SewerFawze, Mahum Betten A, PA-C 02/26/18 1942    Benjiman CorePickering, Nathan, MD 02/26/18 [phone]

## 2018-02-26 NOTE — ED Triage Notes (Signed)
Pt endorses left hip/upper leg pain. Pt has hx of hip replacement. Has been unable to ambulate without pain in the last 4 days. VSS.

## 2018-02-26 NOTE — ED Provider Notes (Addendum)
Patient placed in Quick Look pathway, seen and evaluated   Chief Complaint: L hip pain  HPI:   67 year old male presents with L hip pain for the past several days. He had a L hip replacement in 2015 by Dr. Eulah PontMurphy. He has had similar pain in that hip before however it's never lasted this long. He's had increasing difficulty walking and now has to use a cane or walker. He denies falls, increased exercise. No back pain or knee pain. He's been taking Tylenol and Aleve without significant relief  ROS: +L hip pain  Physical Exam:   Gen: No distress  Neuro: Awake and Alert  Skin: Warm    Focused Exam: L hip: Non-tender   Initiation of care has begun. The patient has been counseled on the process, plan, and necessity for staying for the completion/evaluation, and the remainder of the medical screening examination    Bethel BornGekas, Tiffannie Sloss Marie, PA-C 02/26/18 1524    Bethel BornGekas, Thelmer Legler Marie, PA-C 02/26/18 1525    Jacalyn LefevreHaviland, Julie, MD 02/26/18 [phone]

## 2018-02-26 NOTE — ED Provider Notes (Signed)
[facility] EMERGENCY DEPARTMENT Provider Note   CSN: [id number] Arrival date & time: 02/26/18  1449     History   Chief Complaint Chief Complaint  Patient presents with  . Hip Pain  . Leg Pain    HPI Frank [name] is a 67 y.o. male presenting for evaluation of left hip and leg pain.  Patient states for the past 3 days, he has had gradually worsening left hip and leg pain.  It did not began after a fall or injury.  He has not taken anything for pain, nothing makes it better or worse.  He is currently having difficulty walking due to pain.  Pain does not extend past his knee.  It is of the anterior aspect of his left hip and thigh, no pain medially, laterally, or posteriorly.  No back pain.  He denies fevers or chills.  He denies pain elsewhere.  Patient had a hip replacement with TEulah Pont in 2015, has had no issues since.  Has a history of high blood pressure for which he takes medication, no other medical problems.  Pain is constant, worse with movement and weightbearing.  Nothing makes it better.  HPI  Past Medical History:  Diagnosis Date  . Arthritis    hip- left  . Dental abscess 08/20/2012   "right lower jaw" (08/20/2012)  . Hypertension     Patient Active Problem List   Diagnosis Date Noted  . DJD (degenerative joint disease) 06/29/2014  . Abscess 09/13/2012  . Tinea versicolor 09/01/2012    Past Surgical History:  Procedure Laterality Date  . COLONOSCOPY  02-06-2002   Dr Virginia Rochester- int.hems only - is in epic  . INCISION AND DRAINAGE ABSCESS  08/21/2012   Procedure: INCISION AND DRAINAGE ABSCESS;  Surgeon: Melvenia Beam, MD;  Location: [facility] OR;  Service: ENT;  Laterality: N/A;  . TOTAL HIP ARTHROPLASTY Left 06/29/2014   DR MURPHY  . TOTAL HIP ARTHROPLASTY Left 06/29/2014   Procedure: LEFT TOTAL HIP ARTHROPLASTY ANTERIOR APPROACH;  Surgeon: Sheral Apley, MD;  Location: [facility] OR;  Service: Orthopedics;  Laterality: Left;        Home  Medications    Prior to Admission medications   Medication Sig Start Date End Date Taking? Authorizing Provider  diltiazem (TIAZAC) 300 MG 24 hr capsule Take 300 mg by mouth daily.    [provider]  losartan-hydrochlorothiazide (HYZAAR) 100-25 MG per tablet Take 1 tablet by mouth daily before breakfast.     [provider]  metoprolol succinate (TOPROL-XL) 100 MG 24 hr tablet Take 100 mg by mouth daily before breakfast. Take with or immediately following a meal.    [provider]    Family History Family History  Problem Relation Age of Onset  . Colon cancer Father        > 60  . Colon polyps Father   . Bladder Cancer Father   . Parkinson's disease Father   . Heart disease Mother        MI 2013  . Rectal cancer Neg Hx   . Stomach cancer Neg Hx   . Esophageal cancer Neg Hx     Social History Social History   Tobacco Use  . Smoking status: Never Smoker  . Smokeless tobacco: Never Used  Substance Use Topics  . Alcohol use: No  . Drug use: No     Allergies   Ace inhibitors   Review of Systems Review of Systems  Musculoskeletal: Positive for  arthralgias, gait problem and myalgias.  Neurological: Negative for numbness.  All other systems reviewed and are negative.    Physical Exam Updated Vital Signs BP (!) 165/109 (BP Location: Left Arm)   Pulse 88   Temp 98.8 F (37.1 C) (Oral)   Resp 16   Ht 5\' 5"  (1.651 m)   Wt 75.8 kg (167 lb)   SpO2 99%   BMI 27.79 kg/m   Physical Exam  Constitutional: He is oriented to person, place, and time. He appears well-developed and well-nourished. No distress.  Appears nontoxic  HENT:  Head: Normocephalic and atraumatic.  Eyes: Pupils are equal, round, and reactive to light. Conjunctivae and EOM are normal.  Neck: Normal range of motion. Neck supple.  Cardiovascular: Normal rate, regular rhythm and intact distal pulses.  Pulmonary/Chest: Effort normal and breath sounds normal. No respiratory  distress. He has no wheezes.  Abdominal: Soft. He exhibits no distension. There is no tenderness.  Musculoskeletal: Normal range of motion. He exhibits tenderness. He exhibits no edema or deformity.  Tenderness to palpation of anterior left leg from inguinal ligament to patella.  No pain of medial, lateral, posterior aspects.  No erythema, warmth, or swelling.  No tenderness palpation of the lateral hip.  No pain of the low back.  Decreased pain with passive range of motion, increased pain with active.  Neurological: He is alert and oriented to person, place, and time. No sensory deficit.  Skin: Skin is warm and dry. Capillary refill takes less than 2 seconds.  Psychiatric: He has a normal mood and affect.  Nursing note and vitals reviewed.    ED Treatments / Results  Labs (all labs ordered are listed, but only abnormal results are displayed) Labs Reviewed - No data to display  EKG None  Radiology Dg Hip Unilat W Or Wo Pelvis 2-3 Views Left  Result Date: 02/26/2018 CLINICAL DATA:  67 year old male with anterior left hip pain. No known injury. 2015 left hip replacement. EXAM: DG HIP (WITH OR WITHOUT PELVIS) 2-3V LEFT COMPARISON:  Pelvis radiographs 06/29/2014 and earlier. 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 The acetabular component has also rotated cephalad, resulting in partially uncovered axial surface of the left femoral implant. No dislocation suspected. No acute fracture or dislocation identified about the pelvis or proximal left femur. The right hip joint space is stable. IMPRESSION: 1. 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 Recommend Orthopedic Surgery consultation. 2. No superimposed acute  fracture or dislocation. Electronically Signed   By: Odessa FlemingH  Hall M.D.   On: 02/26/2018 17:22    Procedures Procedures (including critical care time)  Medications Ordered in ED Medications - No data to display   Initial Impression / Assessment and Plan / ED Course  I have reviewed the triage vital signs and the nursing notes.  Pertinent labs & imaging results that were available during my care of the patient were reviewed by me and considered in my medical decision making (see chart for details).     Pt presenting for evaluation of left leg/hip pain.  Physical exam shows patient who is neurovascularly intact.  Will obtain x-ray for further evaluation, especially considering patient's previous hip surgery.  Pain is reproducible with palpation of the musculature.  No signs of infection at this time.  X-ray shows particle disease, recommending orthopedic consultation.  Patient signed out to Ardine Eng, PA-C for further management and discussion with orthopedics.  Discussed findings with patient, he is agreeable to wait for orthopedic consult.   Final Clinical Impressions(s) / ED Diagnoses   Final diagnoses:  None    ED Discharge Orders    None       Alveria Apley, PA-C 02/26/18 1745    Benjiman Core, MD 02/26/18 [phone]

## 2018-02-26 NOTE — ED Notes (Signed)
Patient able to ambulate independently, gait steady but limping due to pain.

## 2018-02-26 NOTE — ED Notes (Signed)
Pt transported to xray 

## 2018-02-26 NOTE — Discharge Instructions (Signed)
Start taking steroid pack.  You may take hydrocodone every 6 hours as needed for pain additionally but do not drive, drink alcohol, or operate heavy machinery while taking this medicine as it may make you drowsy.  Continue to use your walker or cane to help you get around.  Your blood pressure was a little bit elevated today.  Please continue to check your blood pressure and if it remains elevated follow-up with a primary care doctor for reevaluation and medication management.  Call Dr. Greig RightMurphy's office tomorrow morning to set up a follow-up appointment with him in the office on Friday.  Return to the emergency department immediately for any concerning signs or symptoms develop such as redness of the extremity, weakness, fevers, or severe swelling

## 2018-04-14 ENCOUNTER — Other Ambulatory Visit (HOSPITAL_COMMUNITY): Payer: Self-pay | Admitting: Orthopedic Surgery

## 2018-04-14 DIAGNOSIS — M25552 Pain in left hip: Secondary | ICD-10-CM

## 2018-04-14 DIAGNOSIS — Z96642 Presence of left artificial hip joint: Secondary | ICD-10-CM

## 2018-04-22 ENCOUNTER — Encounter (HOSPITAL_COMMUNITY): Payer: Self-pay

## 2018-04-22 ENCOUNTER — Ambulatory Visit (HOSPITAL_COMMUNITY)
Admission: RE | Admit: 2018-04-22 | Discharge: 2018-04-22 | Disposition: A | Discharge status: Home / Self Care | Payer: Medicare HMO | Source: Ambulatory Visit | Attending: Orthopedic Surgery | Admitting: Orthopedic Surgery

## 2018-04-22 DIAGNOSIS — M1612 Unilateral primary osteoarthritis, left hip: Secondary | ICD-10-CM | POA: Insufficient documentation

## 2018-04-22 DIAGNOSIS — Z96642 Presence of left artificial hip joint: Secondary | ICD-10-CM | POA: Insufficient documentation

## 2018-04-22 DIAGNOSIS — M25552 Pain in left hip: Secondary | ICD-10-CM | POA: Insufficient documentation

## 2018-04-22 DIAGNOSIS — M247 Protrusio acetabuli: Secondary | ICD-10-CM | POA: Diagnosis not present

## 2018-04-22 DIAGNOSIS — M21852 Other specified acquired deformities of left thigh: Secondary | ICD-10-CM | POA: Diagnosis not present

## 2018-06-23 ENCOUNTER — Encounter: Payer: Self-pay | Admitting: Cardiology

## 2018-06-23 ENCOUNTER — Ambulatory Visit: Payer: Medicare HMO | Admitting: Cardiology

## 2018-06-23 VITALS — BP 138/89 | HR 67 | Ht 65.0 in | Wt 163.5 lb

## 2018-06-23 DIAGNOSIS — R001 Bradycardia, unspecified: Secondary | ICD-10-CM | POA: Diagnosis not present

## 2018-06-23 DIAGNOSIS — Z01810 Encounter for preprocedural cardiovascular examination: Secondary | ICD-10-CM | POA: Insufficient documentation

## 2018-06-23 DIAGNOSIS — R9431 Abnormal electrocardiogram [ECG] [EKG]: Secondary | ICD-10-CM | POA: Insufficient documentation

## 2018-06-23 NOTE — Progress Notes (Addendum)
PCP: Iona Hansen, NP  Clinic Note: Chief Complaint  Patient presents with  . Pre-op Exam  . Bradycardia    HPI: Frank [name] is a 67 y.o. male with a who presents today for preoperative cardiac evaluation -referred for cardiology evaluation because of bradycardia on EKG.  He has been seen today at the request of Iona Hansen, NP.  Frank [name] was last seen on October 10 by his PCP -for preop evaluation for pending left hip surgery..  EKG showed heart rate of 52 bpm with various other minor abnormalities noted below. --Past medical history includes hypertension and hyperlipidemia.  10-year ASCVD risk score Denman George DC Jr., et al., 2013) is: 15.6%  Recent Hospitalizations:   ER visit February 26, 2018 for left hip pain  Studies Personally Reviewed - (if available, images/films reviewed: From Epic Chart or Care Everywhere)  EKG from PCP: Sinus bradycardia with sinus arrhythmia (rate 52 bpm.  Possible LAA.  Incomplete RBBB.  L axis Deviation (Axis -67) w/ LVH.  Interval History: Frank [name] is a pleasant gentleman accompanied by his mother and brother.  He himself is not a very good historian.  Apparently back in June he started having significant left-sided hip pain associated with his prior prosthetic hip.  He now is to the point where he is basically wheelchair-bound and not able to walk.  They are hoping to potentially go back in to redo surgery on his hip.  As part of an evaluation his EKG showed some abnormalities.  [name] was relatively mobile up until June, but has not had any mobility since then.  Prior to June.  He denied any resting or exertional chest tightness or pressure.  He still has no chest discomfort.  No sense of irregular heartbeats or palpitations.  No PND, orthopnea or edema.  No syncope or near syncope, no TIA or amaurosis fugax symptoms.  No sense of fatigue or decreased "get up and go ".  He says that he has not had any issues with exercise in the  past. Clearly, as he is not walking, we cannot assess for claudication.  ROS: A comprehensive was performed. Review of Systems  Constitutional: Negative for chills, fever, malaise/fatigue (More just that he is somewhat deconditioned now because his been in a wheelchair.) and weight loss.  HENT: Negative for nosebleeds.   Eyes:       Has underlying vision disease.  I did not get any details  Respiratory: Negative for hemoptysis and shortness of breath.   Gastrointestinal: Negative for blood in stool and heartburn.  Genitourinary: Negative for frequency.  Musculoskeletal: Positive for joint pain (Left hip). Negative for falls.  Neurological: Negative for dizziness and focal weakness (Not really focal weakness, just because of pain in his hip he does not use the leg now).  Psychiatric/Behavioral: Positive for memory loss. Negative for depression. The patient is not nervous/anxious.   All other systems reviewed and are negative.   I have reviewed and (if needed) personally updated the patient's problem list, medications, allergies, past medical and surgical history, social and family history.   Past Medical History:  Diagnosis Date  . Dental abscess 08/20/2012   "right lower jaw" (08/20/2012)  . Fracture of fibula 2013  . Hyperlipidemia    Taking Pravachol.  . Hypertension    Home SBP range 130s/70-80s.  Takes diltiazem 300 mg daily, Toprol 100 mg daily, losartan-HCTZ 100/25 mg daily, hydralazine 25 mg twice daily.  . Osteoarthritis of hip  Left hip status post arthroplasty with pending follow-up arthroplasty  . Visual disorder    Specifics unknown    Past Surgical History:  Procedure Laterality Date  . COLONOSCOPY  02-06-2002   Dr Virginia Rochester- int.hems only - is in epic  . INCISION AND DRAINAGE ABSCESS  08/21/2012   Procedure: INCISION AND DRAINAGE ABSCESS;  Surgeon: Melvenia Beam, MD;  Location: [facility] OR;  Service: ENT;  Laterality: N/A;  . TONSILLECTOMY    . TOTAL HIP ARTHROPLASTY Left  06/29/2014   Procedure: LEFT TOTAL HIP ARTHROPLASTY ANTERIOR APPROACH;  Surgeon: Sheral Apley, MD;  Location: [facility] OR;  Service: Orthopedics;  Laterality: Left;    Current Meds  Medication Sig  . diltiazem (TIAZAC) 300 MG 24 hr capsule Take 300 mg by mouth daily.  Marland Kitchen HYDROcodone-acetaminophen (NORCO/VICODIN) 5-325 MG tablet Take 1 tablet by mouth every 6 (six) hours as needed for severe pain.  Marland Kitchen losartan-hydrochlorothiazide (HYZAAR) 100-25 MG per tablet Take 1 tablet by mouth daily before breakfast.   . metoprolol succinate (TOPROL-XL) 100 MG 24 hr tablet Take 100 mg by mouth daily before breakfast. Take with or immediately following a meal.  . predniSONE (STERAPRED UNI-PAK 21 TAB) 10 MG (21) TBPK tablet Take by mouth daily. Take 6 tabs by mouth daily  for 2 days, then 5 tabs for 2 days, then 4 tabs for 2 days, then 3 tabs for 2 days, 2 tabs for 2 days, then 1 tab by mouth daily for 2 days    Allergies  Allergen Reactions  . Ace Inhibitors     cough    Social History   Tobacco Use  . Smoking status: Never Smoker  . Smokeless tobacco: Never Used  Substance Use Topics  . Alcohol use: No  . Drug use: No   Social History   Social History Narrative  . Not on file    family history includes Bladder Cancer in his father; Colon cancer in his father; Colon polyps in his father; Diabetes in his father and mother; Heart attack in his mother; Hypertension in his mother; Parkinson's disease in his father.  Wt Readings from Last 3 Encounters:  06/23/18 163 lb 8 oz (74.2 kg)  02/26/18 167 lb (75.8 kg)  03/09/15 176 lb (79.8 kg)    PHYSICAL EXAM BP 138/89   Pulse 67   Ht 5\' 5"  (1.651 m)   Wt 163 lb 8 oz (74.2 kg)   BMI 27.21 kg/m  Physical Exam  Constitutional: He is oriented to person, place, and time. He appears well-developed and well-nourished. No distress.  Pleasant gentleman.  In a wheelchair.  Has a antalgic with sitting.  Seems a little bit slow, poor historian.  HENT:    Head: Normocephalic.  Mouth/Throat: Oropharynx is clear and moist.  Eyes: Conjunctivae are normal.  Disconjugate gaze.  Difficult to assess.  I did not successfully do extraocular muscle evaluation.  Neck: Normal range of motion. Neck supple. No hepatojugular reflux and no JVD present. Carotid bruit is not present.  Cardiovascular: Normal rate, regular rhythm, normal heart sounds, intact distal pulses and normal pulses.  No extrasystoles are present. PMI is not displaced. Exam reveals no gallop and no friction rub.  No murmur heard. Pulmonary/Chest: Effort normal and breath sounds normal. No respiratory distress. He has no wheezes. He has no rales.  Abdominal: Soft. Bowel sounds are normal. He exhibits no distension. There is no tenderness. There is no rebound.  Musculoskeletal: He exhibits no edema.  Significantly reduced range of  motion with left hip.  Sitting in a wheelchair.  Neurological: He is alert and oriented to person, place, and time. No cranial nerve deficit (All but extraocular muscles intact).  Skin: Skin is warm and dry.  Psychiatric: Judgment normal.  Somewhat blunted affect.  Not very responsive.  At times looks to his mother questions.  Vitals reviewed.    Adult ECG Report  Rate: 67 ;  Rhythm: normal sinus rhythm and Borderline features of LVH with left axis deviation -70 (if not LVH this would be LAFB).  No evidence of incomplete RBBB.  Narrative Interpretation: Mildly abnormal EKG but not grossly abnormal.  Rate   Other studies Reviewed: Additional studies/ records that were reviewed today include:  Recent Labs: 06/19/2018  Na+ 140, K+ 4.0, Cl- 105, HCO3-30, BUN 9, Cr 0.93, Glu 95, Ca2+ 9.3; AST 20, ALT 19, AlkP 112  CBC: W 6.7, H/H 15.1/45.9, Plt 169   TC 130, TG 64, HDL 73, LDL 44   ASSESSMENT / PLAN: Problem List Items Addressed This Visit    Abnormal electrocardiogram (ECG) (EKG)    Somewhat abnormal EKG with mild sinus bradycardia, leftward axis with  either LAFB or LAD.  Based lead EKG is not overly abnormal.  If there were any symptoms to suggest any potential CHF type symptoms, I would recommend checking 2D echocardiogram, but really in the absence of any notable symptoms I do not see how this would help [place] from from a perioperative standpoint.  Perhaps in the future when relatively important surgery is recommended, we could consider baseline CAD evaluation with coronary calcium score and coronary CTA, but in the absence of symptoms there is no indication for that now.       Preoperative cardiovascular examination - Primary    Hip surgery, while a major surgery is not high risk from cardiac standpoint.  Would be considered intermediate risk. Patient himself denies any history of stroke heart failure, angina.  He has normal renal function and is nondiabetic.  As such, he would be a low risk patient for intermediate risk surgery. In the absence of any active symptoms of chest pain or shortness of breath at rest or exertion, I see no reason to do any further ischemic evaluation with stress test.  He has no heart failure symptoms therefore I have no reason to check a 2D echocardiogram.    Further evaluation would simply delay his much needed surgery as he is now been wheelchair-bound for several months. Monitor we will simply be to determine potential for him to become bradycardic intraoperatively.  Would avoid AV nodal agents besides his standing Toprol which is quite protective.  Would probably hold diltiazem perioperatively.      Relevant Orders   HOLTER MONITOR - 24 HOUR   EKG 12-Lead   Sinus bradycardia by electrocardiogram    Heart rate of 52 on EKG in the setting of high-dose diltiazem and metoprolol.  This is not unexpected. If there is concern for bradycardia intraoperatively, would consider holding diltiazem on the day of surgery.  We will check a 24-hour monitor to ensure there is no severe resting bradycardia, but otherwise the  fact that his heart rate now is 63 needs if this is probably a benign finding.  Would not further evaluate.      Relevant Orders   HOLTER MONITOR - 24 HOUR   EKG 12-Lead      Current medicines are reviewed at length with the patient today.  (+/- concerns) none The following  changes have been made:  None  Patient Instructions  Medication Instructions:  NOT NEEDED If you need a refill on your cardiac medications before your next appointment, please call your pharmacy.    Lab work: NOT NEEDED If you have labs (blood work) drawn today and your tests are completely normal, you will receive your results only by: Marland Kitchen MyChart Message (if you have MyChart) OR . A paper copy in the mail If you have any lab test that is abnormal or we need to change your treatment, we will call you to review the results.    Testing/Procedures:   SCHEDULE AT [address] Your physician has recommended that you wear a holter monitor 24 HOUR. Holter monitors are medical devices that record the heart's electrical activity. Doctors most often use these monitors to diagnose arrhythmias. Arrhythmias are problems with the speed or rhythm of the heartbeat. The monitor is a small, portable device. You can wear one while you do your normal daily activities. This is usually used to diagnose what is causing palpitations/syncope (passing out).    Follow-Up: At [facility], you and your health needs are our priority.  As part of our continuing mission to provide you with exceptional heart care, we have created designated Provider Care Teams.  These Care Teams include your primary Cardiologist (physician) and Advanced Practice Providers (APPs -  Physician Assistants and Nurse Practitioners) who all work together to provide you with the care you need, when you need it. You will need a follow up appointment in ON AN AS NEEDED BASIS IF TEST OR NORMAL - YOU WILL BE CLEARED FOR SURGERY    Any Other  Special Instructions Will Be Listed Below (If Applicable).    Studies Ordered:   Orders Placed This Encounter  Procedures  . HOLTER MONITOR - 24 HOUR  . EKG 12-Lead    ADDENDUM: Monitor showed lowest heart rate of 43 bpm but able to increase his heart rate to 90.  For pacemaker.  As there was no severe reduction in heart rate, there is no reason to delay surgery.  Okay to proceed with surgery. Would simply hold diltiazem 1 day preop.  Bryan Lemma, MD    Bryan Lemma, M.D., M.S. Interventional Cardiologist   Pager # [phone] Phone # [phone] [address] [place], [place] [address]   Thank you for choosing Heartcare at [facility]!!

## 2018-06-23 NOTE — Patient Instructions (Addendum)
Medication Instructions:  NOT NEEDED If you need a refill on your cardiac medications before your next appointment, please call your pharmacy.    Lab work: NOT NEEDED If you have labs (blood work) drawn today and your tests are completely normal, you will receive your results only by: Marland Kitchen MyChart Message (if you have MyChart) OR . A paper copy in the mail If you have any lab test that is abnormal or we need to change your treatment, we will call you to review the results.    Testing/Procedures:   SCHEDULE AT [address] Your physician has recommended that you wear a holter monitor 24 HOUR. Holter monitors are medical devices that record the heart's electrical activity. Doctors most often use these monitors to diagnose arrhythmias. Arrhythmias are problems with the speed or rhythm of the heartbeat. The monitor is a small, portable device. You can wear one while you do your normal daily activities. This is usually used to diagnose what is causing palpitations/syncope (passing out).    Follow-Up: At [facility], you and your health needs are our priority.  As part of our continuing mission to provide you with exceptional heart care, we have created designated Provider Care Teams.  These Care Teams include your primary Cardiologist (physician) and Advanced Practice Providers (APPs -  Physician Assistants and Nurse Practitioners) who all work together to provide you with the care you need, when you need it. You will need a follow up appointment in ON AN AS NEEDED BASIS IF TEST OR NORMAL - YOU WILL BE CLEARED FOR SURGERY    Any Other Special Instructions Will Be Listed Below (If Applicable).

## 2018-06-23 NOTE — Assessment & Plan Note (Signed)
Hip surgery, while a major surgery is not high risk from cardiac standpoint.  Would be considered intermediate risk. Patient himself denies any history of stroke heart failure, angina.  He has normal renal function and is nondiabetic.  As such, he would be a low risk patient for intermediate risk surgery. In the absence of any active symptoms of chest pain or shortness of breath at rest or exertion, I see no reason to do any further ischemic evaluation with stress test.  He has no heart failure symptoms therefore I have no reason to check a 2D echocardiogram.    Further evaluation would simply delay his much needed surgery as he is now been wheelchair-bound for several months. Monitor we will simply be to determine potential for him to become bradycardic intraoperatively.  Would avoid AV nodal agents besides his standing Toprol which is quite protective.  Would probably hold diltiazem perioperatively.

## 2018-06-23 NOTE — Assessment & Plan Note (Signed)
Heart rate of 52 on EKG in the setting of high-dose diltiazem and metoprolol.  This is not unexpected. If there is concern for bradycardia intraoperatively, would consider holding diltiazem on the day of surgery.  We will check a 24-hour monitor to ensure there is no severe resting bradycardia, but otherwise the fact that his heart rate now is 63 needs if this is probably a benign finding.  Would not further evaluate.

## 2018-06-23 NOTE — Assessment & Plan Note (Signed)
Somewhat abnormal EKG with mild sinus bradycardia, leftward axis with either LAFB or LAD.  Based lead EKG is not overly abnormal.  If there were any symptoms to suggest any potential CHF type symptoms, I would recommend checking 2D echocardiogram, but really in the absence of any notable symptoms I do not see how this would help [place] from from a perioperative standpoint.  Perhaps in the future when relatively important surgery is recommended, we could consider baseline CAD evaluation with coronary calcium score and coronary CTA, but in the absence of symptoms there is no indication for that now.

## 2018-07-01 ENCOUNTER — Ambulatory Visit (INDEPENDENT_AMBULATORY_CARE_PROVIDER_SITE_OTHER): Payer: Medicare HMO

## 2018-07-01 DIAGNOSIS — Z01810 Encounter for preprocedural cardiovascular examination: Secondary | ICD-10-CM

## 2018-07-01 DIAGNOSIS — R001 Bradycardia, unspecified: Secondary | ICD-10-CM

## 2018-07-15 ENCOUNTER — Telehealth: Payer: Self-pay | Admitting: Cardiology

## 2018-07-15 NOTE — Telephone Encounter (Signed)
Spoke to patient. Result given . Verbalized understanding  aware  Dr Herbie Baltimore will have to clear for hip surgery  Awaiting future call back

## 2018-07-15 NOTE — Telephone Encounter (Signed)
New Message   Patients brother is calling on his behalf of patient to obtain results of the holter monitor. Please call patient and advise.

## 2018-07-17 NOTE — Telephone Encounter (Signed)
SPOKE OF PATIENT AND MOTHER - SINCE HOLTER MONITOR  WAS UNREMARKABLE STUDY PER DR HARDING, PATIENT IS CLEARED FOR SURGERY  - AND DOES NOT NEED TO FOLLOW UP.   PATIENT'S MOTHER WILL CALL BACK WITH THE SURGEON NAME, PHONE AND FAX. PATIENT STATES THE SURGEON IS AT [facility].

## 2018-07-18 NOTE — Telephone Encounter (Signed)
Follow up   Pt's mother is calling to provide the Name Phone number and fax for the Surgeon. Dr. Anastasio Champion  Office #: [phone] Nurse #: [phone] Fax #: [phone]

## 2018-07-18 NOTE — Telephone Encounter (Signed)
Tried to forward to requesting provider, note does not say cleared for surgery. Will defer to [facility]  RN to forward to requesting provider

## 2018-07-18 NOTE — Telephone Encounter (Signed)
Once a patient is seen in clinic by a provider to assess pre-op clearance, this does not come back to the pre-op pool. Will forward to nurse to handle who was awaiting surgeon info. Will remove from APP pre-op pool. Dayna Dunn PA-C

## 2018-07-21 NOTE — Telephone Encounter (Signed)
SPOKE TO MOTHER , INFORMED HER OFFICE NOTE  10 /14/19-WAS ROUTED TO DR DELGAIZO'S OFFICE, GIVING CLEARANCE  FOR  HIP SURGERY

## 2019-01-10 IMAGING — CT CT HIP*L* W/O CM
2 of 3 series · 17 of 46 positions shown, 19 images · non-contrast
Comparison: Radiographs 06/29/2014 and 02/26/2018. Left hip MRI
04/26/2011.

CLINICAL DATA: Left hip pain.  Left hip arthroplasty 06/29/2014.

EXAM:
CT OF THE LEFT HIP WITHOUT CONTRAST
TECHNIQUE: Multidetector CT imaging of the left hip was performed according to
the standard protocol. Multiplanar CT image reconstructions were
also generated.

[Series 5: hip (person_name) (person_name) · axial · 0.43mm/px · z∈[-784,-574]mm · 14 of 50 slices shown, 16 images]
[im 4/50  soft-tissue]
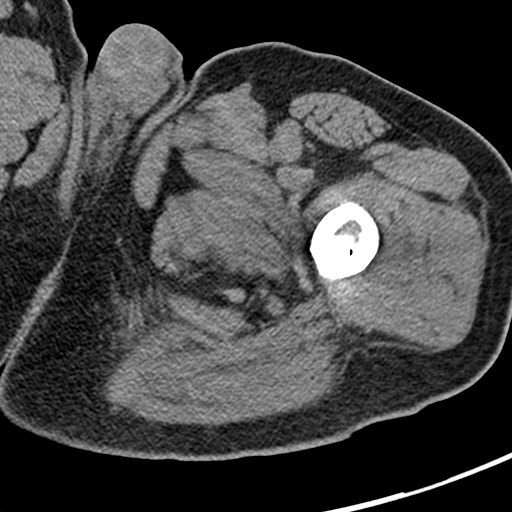
[im 4/50  bone]
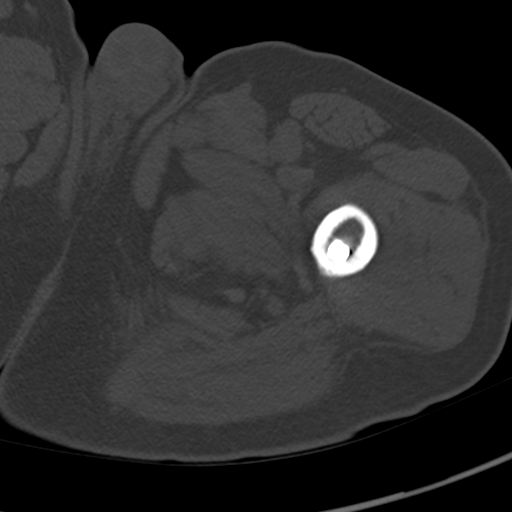
[im 7/50  soft-tissue]
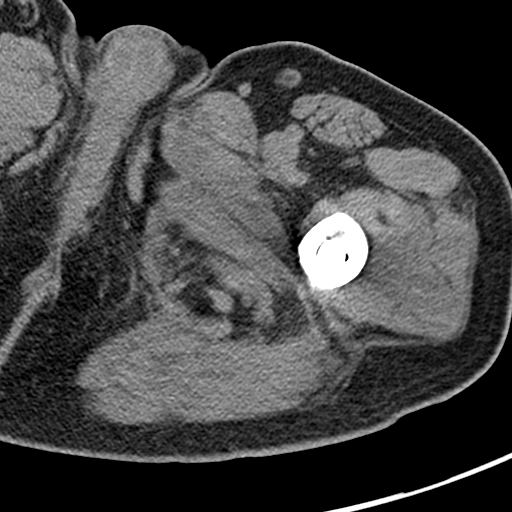
[im 10/50  soft-tissue]
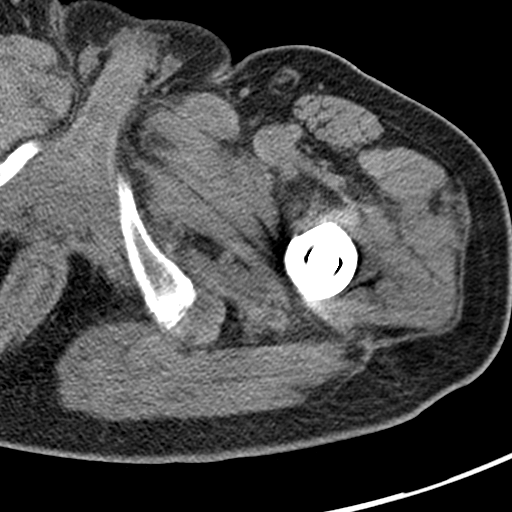
[im 13/50  soft-tissue]
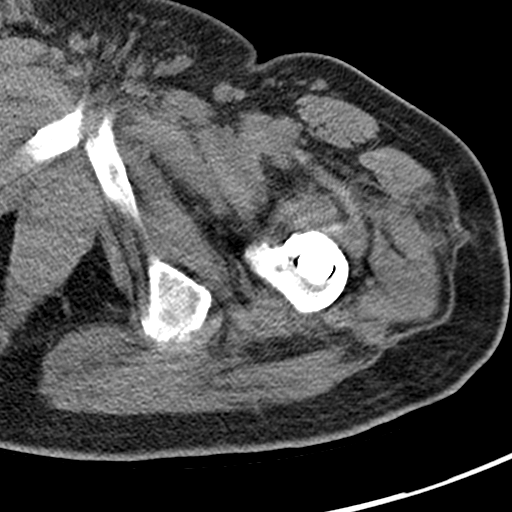
[im 16/50  soft-tissue]
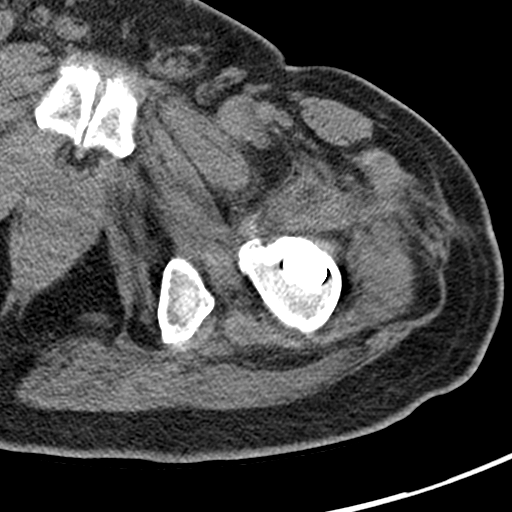
[im 19/50  soft-tissue]
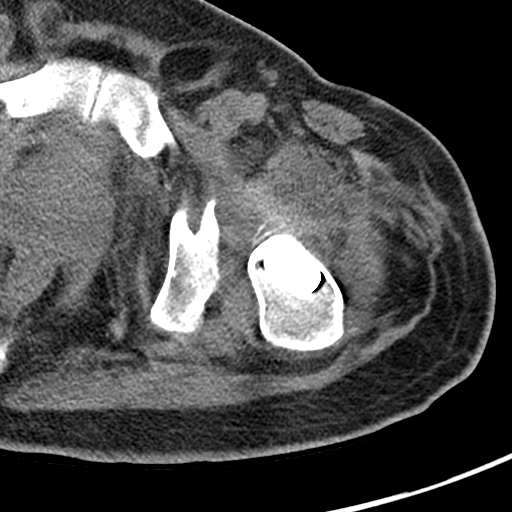
[im 23/50  soft-tissue]
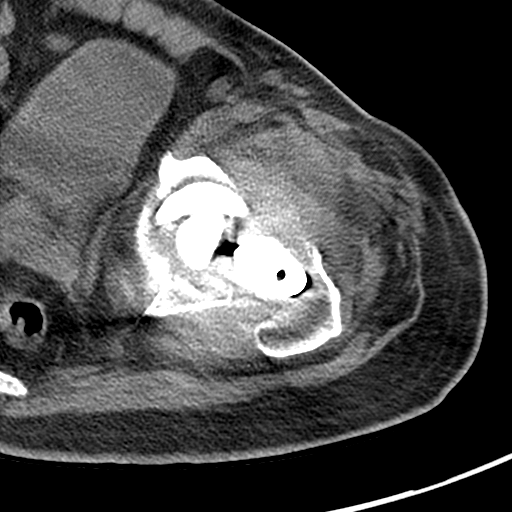
[im 27/50  soft-tissue]
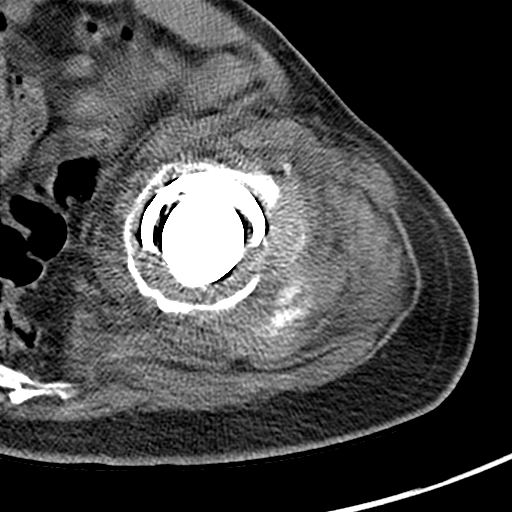
[im 31/50  soft-tissue]
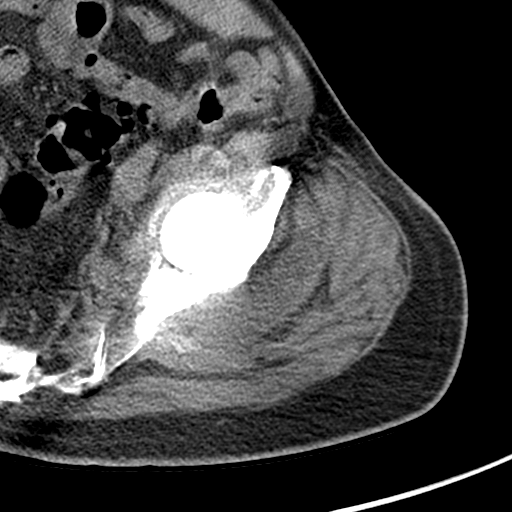
[im 31/50  bone]
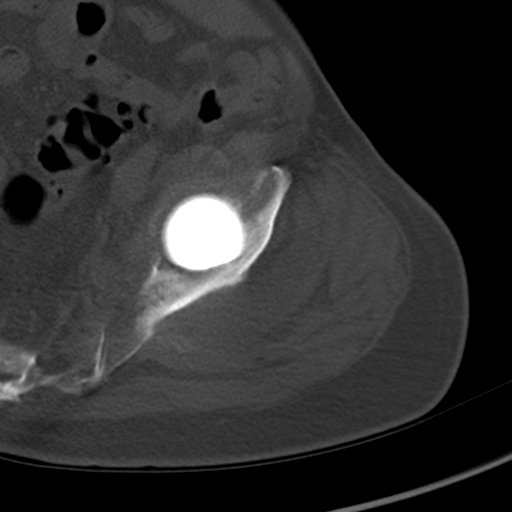
[im 34/50  soft-tissue]
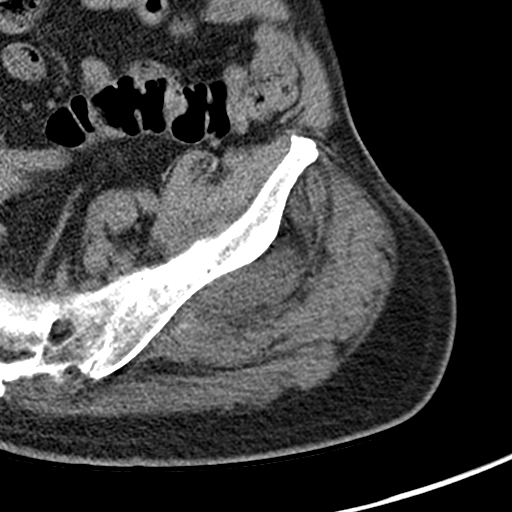
[im 37/50  soft-tissue]
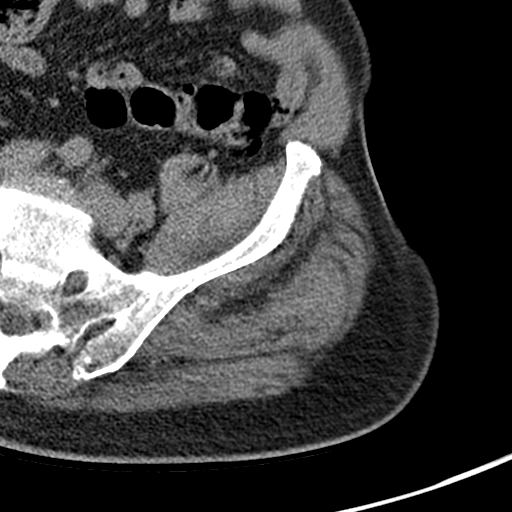
[im 40/50  soft-tissue]
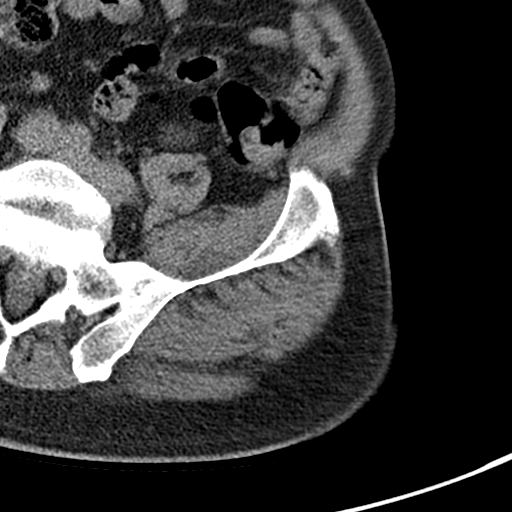
[im 43/50  soft-tissue]
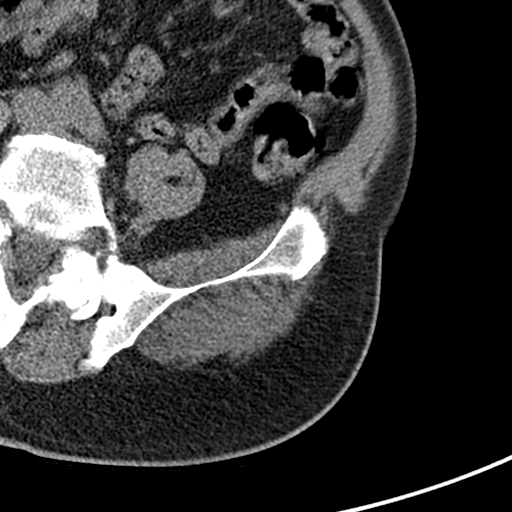
[im 46/50  soft-tissue]
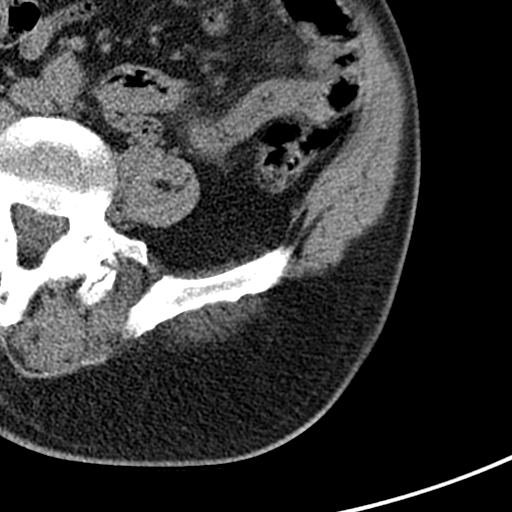

[Series 604: coronal st · coronal · 0.49mm/px · 3 of 106 slices shown]
[im 36/106  soft-tissue]
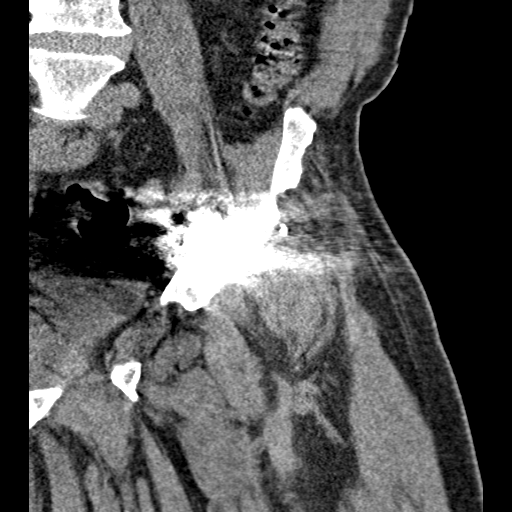
[im 47/106  soft-tissue]
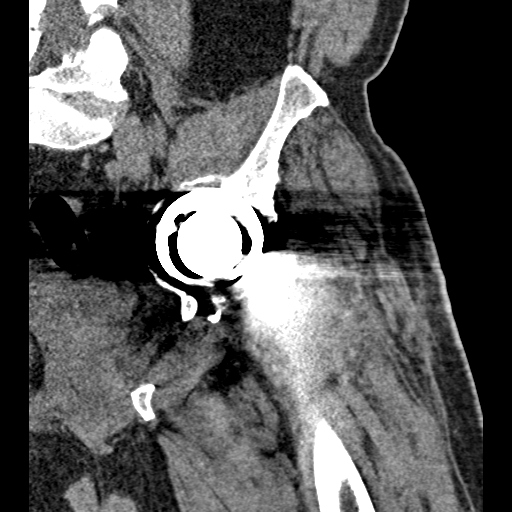
[im 59/106  soft-tissue]
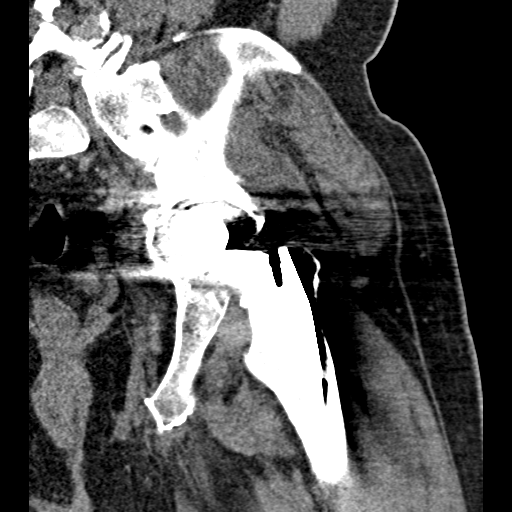

[17 of 46 positions shown; findings below may reference images not displayed]

FINDINGS: Bones/Joint/Cartilage

Examination is limited to the left hip and inferior left hemipelvis.
As demonstrated on recent radiographs, the patient is status post
left total hip arthroplasty with moderate acetabular protrusio.
There is marked thinning and medial displacement of the medial wall
of the left acetabulum. No pathologic fracture identified. There is
marked lucency surrounding the acetabular cup consistent with
particle disease. There is resulting lateral rotation of the
acetabular cup. No loosening of the femoral prosthesis is
demonstrated.

The left sacroiliac joint appears fused. There is prominent facet
arthropathy within the visualized lower lumbar spine.

Ligaments

Not relevant for exam/indication.

Muscles and Tendons

No focal muscle or tendon abnormality identified.

Soft tissues

There is a hip joint effusion with ill-defined soft tissue
thickening surrounding the joint, likely a combination of synovitis
and reactive pseudotumor. A small amount of fluid and inflammation
tracks along the pelvic sidewall.
IMPRESSION: 1. Findings are consistent with particle disease involving the left
acetabular cup with marked osteolysis, acetabular protrusio and
rotation of the acetabular cup. There is inflammation surrounding
the hip, likely a combination of synovitis and reactive pseudotumor.
2. No loosening of the femoral component identified.

## 2019-05-22 DIAGNOSIS — T84031A Mechanical loosening of internal left hip prosthetic joint, initial encounter: Secondary | ICD-10-CM | POA: Insufficient documentation

## 2019-06-27 DIAGNOSIS — E785 Hyperlipidemia, unspecified: Secondary | ICD-10-CM | POA: Insufficient documentation

## 2019-06-27 DIAGNOSIS — H409 Unspecified glaucoma: Secondary | ICD-10-CM | POA: Insufficient documentation

## 2020-01-06 ENCOUNTER — Other Ambulatory Visit: Payer: Self-pay

## 2020-01-06 ENCOUNTER — Ambulatory Visit (INDEPENDENT_AMBULATORY_CARE_PROVIDER_SITE_OTHER): Payer: Medicare HMO | Admitting: Podiatry

## 2020-01-06 DIAGNOSIS — B351 Tinea unguium: Secondary | ICD-10-CM

## 2020-01-06 DIAGNOSIS — M79674 Pain in right toe(s): Secondary | ICD-10-CM

## 2020-01-06 DIAGNOSIS — M79675 Pain in left toe(s): Secondary | ICD-10-CM

## 2020-01-07 ENCOUNTER — Encounter: Payer: Self-pay | Admitting: Podiatry

## 2020-01-07 NOTE — Progress Notes (Signed)
  Subjective:  Patient ID: Frank [name], male    DOB: 10-24-1950,  MRN: [id number]  Chief Complaint  Patient presents with  . Nail Problem    pt is here for thick and discolored toenails.   69 y.o. male returns for the above complaint.  Patient presents with thickened elongated dystrophic toenails x10.  They are painful when ambulating.  Patient states he is unable to cut them would like to have [place] debrided them down.  He denies any other acute complaints.  He states that he is not a diabetic.  Objective:  There were no vitals filed for this visit. Podiatric Exam: Vascular: dorsalis pedis and posterior tibial pulses are palpable bilateral. Capillary return is immediate. Temperature gradient is WNL. Skin turgor WNL  Sensorium: Normal Semmes Weinstein monofilament test. Normal tactile sensation bilaterally. Nail Exam: Pt has thick disfigured discolored nails with subungual debris noted bilateral entire nail hallux through fifth toenails Ulcer Exam: There is no evidence of ulcer or pre-ulcerative changes or infection. Orthopedic Exam: Muscle tone and strength are WNL. No limitations in general ROM. No crepitus or effusions noted. HAV  B/L.  Hammer toes 2-5  B/L. Skin: No Porokeratosis. No infection or ulcers  Assessment & Plan:  Patient was evaluated and treated and all questions answered.  Onychomycosis with pain  -Nails palliatively debrided as below. -Educated on self-care  Procedure: Nail Debridement Rationale: pain  Type of Debridement: manual, sharp debridement. Instrumentation: Nail nipper, rotary burr. Number of Nails: 10  Procedures and Treatment: Consent by patient was obtained for treatment procedures. The patient understood the discussion of treatment and procedures well. All questions were answered thoroughly reviewed. Debridement of mycotic and hypertrophic toenails, 1 through 5 bilateral and clearing of subungual debris. No ulceration, no infection noted.  Return  Visit-Office Procedure: Patient instructed to return to the office for a follow up visit 3 months for continued evaluation and treatment.  Nicholes Rough, DPM    No follow-ups on file.

## 2020-04-06 ENCOUNTER — Other Ambulatory Visit: Payer: Self-pay

## 2020-04-06 ENCOUNTER — Ambulatory Visit (INDEPENDENT_AMBULATORY_CARE_PROVIDER_SITE_OTHER): Payer: Medicare HMO | Admitting: Podiatry

## 2020-04-06 ENCOUNTER — Encounter: Payer: Self-pay | Admitting: Podiatry

## 2020-04-06 DIAGNOSIS — L853 Xerosis cutis: Secondary | ICD-10-CM

## 2020-04-06 DIAGNOSIS — M79675 Pain in left toe(s): Secondary | ICD-10-CM | POA: Diagnosis not present

## 2020-04-06 DIAGNOSIS — M79674 Pain in right toe(s): Secondary | ICD-10-CM

## 2020-04-06 DIAGNOSIS — B351 Tinea unguium: Secondary | ICD-10-CM

## 2020-04-07 ENCOUNTER — Encounter: Payer: Self-pay | Admitting: Podiatry

## 2020-04-07 NOTE — Progress Notes (Signed)
  Subjective:  Patient ID: Frank [name], male    DOB: 20-Jul-1951,  MRN: [id number]  Chief Complaint  Patient presents with  . Nail Problem    nail routinue- pt requesting nail trim- long and thick and sligt discoloration further evaluation    69 y.o. male returns for the above complaint.  Patient presents with thickened elongated dystrophic toenails x10.  They are painful when ambulating.  Patient states he is unable to cut them would like to have [place] debrided them down.  He denies any other acute complaints.  He states that he is not a diabetic.  Patient would also like to discuss dry skin to bilateral lower extremity.  Patient states that he would not like to know if there is anything that can be done to treat this.  He denies any other acute complaints.  Objective:  There were no vitals filed for this visit. Podiatric Exam: Vascular: dorsalis pedis and posterior tibial pulses are palpable bilateral. Capillary return is immediate. Temperature gradient is WNL. Skin turgor WNL  Sensorium: Normal Semmes Weinstein monofilament test. Normal tactile sensation bilaterally. Nail Exam: Pt has thick disfigured discolored nails with subungual debris noted bilateral entire nail hallux through fifth toenails Ulcer Exam: There is no evidence of ulcer or pre-ulcerative changes or infection. Orthopedic Exam: Muscle tone and strength are WNL. No limitations in general ROM. No crepitus or effusions noted. HAV  B/L.  Hammer toes 2-5  B/L. Skin: No Porokeratosis. No infection or ulcers.  Mild xerosis/dry skin noted to bilateral lower extremity  Assessment & Plan:  Patient was evaluated and treated and all questions answered.  Xerosis bilateral mild -I explained to the patient the etiology of xerosis and various treatment options were extensively discussed.  I explained to the patient the importance of maintaining moisturization of the skin with application of over-the-counter lotion such as Eucerin  or Luciderm.  I have asked the patient to apply this twice a day.  If unable to resolve patient will benefit from prescription lotion.   Onychomycosis with pain  -Nails palliatively debrided as below. -Educated on self-care  Procedure: Nail Debridement Rationale: pain  Type of Debridement: manual, sharp debridement. Instrumentation: Nail nipper, rotary burr. Number of Nails: 10  Procedures and Treatment: Consent by patient was obtained for treatment procedures. The patient understood the discussion of treatment and procedures well. All questions were answered thoroughly reviewed. Debridement of mycotic and hypertrophic toenails, 1 through 5 bilateral and clearing of subungual debris. No ulceration, no infection noted.  Return Visit-Office Procedure: Patient instructed to return to the office for a follow up visit 3 months for continued evaluation and treatment.  Nicholes Rough, DPM    No follow-ups on file.

## 2020-07-06 ENCOUNTER — Encounter: Payer: Self-pay | Admitting: Podiatry

## 2020-07-06 ENCOUNTER — Ambulatory Visit: Payer: Medicare HMO | Admitting: Podiatry

## 2020-07-06 ENCOUNTER — Other Ambulatory Visit: Payer: Self-pay

## 2020-07-06 DIAGNOSIS — B351 Tinea unguium: Secondary | ICD-10-CM | POA: Diagnosis not present

## 2020-07-06 DIAGNOSIS — M79675 Pain in left toe(s): Secondary | ICD-10-CM

## 2020-07-06 DIAGNOSIS — M79674 Pain in right toe(s): Secondary | ICD-10-CM

## 2020-07-06 NOTE — Progress Notes (Signed)
  Subjective:  Patient ID: Frank [name], male    DOB: [birth year],  MRN: [id number]  Chief Complaint  Patient presents with  . routine foot care    3 month rfc nail trim    69 y.o. male returns for the above complaint.  Patient presents with thickened elongated dystrophic toenails x10.  They are painful when ambulating.  Patient states he is unable to cut them would like to have [place] debrided them down.  He denies any other acute complaints.  He states that he is not a diabetic.   Objective:  There were no vitals filed for this visit. Podiatric Exam: Vascular: dorsalis pedis and posterior tibial pulses are palpable bilateral. Capillary return is immediate. Temperature gradient is WNL. Skin turgor WNL  Sensorium: Normal Semmes Weinstein monofilament test. Normal tactile sensation bilaterally. Nail Exam: Pt has thick disfigured discolored nails with subungual debris noted bilateral entire nail hallux through fifth toenails Ulcer Exam: There is no evidence of ulcer or pre-ulcerative changes or infection. Orthopedic Exam: Muscle tone and strength are WNL. No limitations in general ROM. No crepitus or effusions noted. HAV  B/L.  Hammer toes 2-5  B/L. Skin: No Porokeratosis. No infection or ulcers.  Mild xerosis/dry skin noted to bilateral lower extremity  Assessment & Plan:  Patient was evaluated and treated and all questions answered.  Xerosis bilateral mild -I explained to the patient the etiology of xerosis and various treatment options were extensively discussed.  I explained to the patient the importance of maintaining moisturization of the skin with application of over-the-counter lotion such as Eucerin or Luciderm.  I have asked the patient to apply this twice a day.  If unable to resolve patient will benefit from prescription lotion.   Onychomycosis with pain  -Nails palliatively debrided as below. -Educated on self-care  Procedure: Nail Debridement Rationale: pain  Type of  Debridement: manual, sharp debridement. Instrumentation: Nail nipper, rotary burr. Number of Nails: 10  Procedures and Treatment: Consent by patient was obtained for treatment procedures. The patient understood the discussion of treatment and procedures well. All questions were answered thoroughly reviewed. Debridement of mycotic and hypertrophic toenails, 1 through 5 bilateral and clearing of subungual debris. No ulceration, no infection noted.  Return Visit-Office Procedure: Patient instructed to return to the office for a follow up visit 3 months for continued evaluation and treatment.  Nicholes Rough, DPM    No follow-ups on file.

## 2020-10-07 ENCOUNTER — Encounter: Payer: Self-pay | Admitting: Podiatry

## 2020-10-07 ENCOUNTER — Ambulatory Visit (INDEPENDENT_AMBULATORY_CARE_PROVIDER_SITE_OTHER): Payer: Medicare HMO | Admitting: Podiatry

## 2020-10-07 ENCOUNTER — Other Ambulatory Visit: Payer: Self-pay

## 2020-10-07 DIAGNOSIS — M79674 Pain in right toe(s): Secondary | ICD-10-CM

## 2020-10-07 DIAGNOSIS — M79675 Pain in left toe(s): Secondary | ICD-10-CM

## 2020-10-07 DIAGNOSIS — B351 Tinea unguium: Secondary | ICD-10-CM | POA: Diagnosis not present

## 2020-10-07 NOTE — Progress Notes (Signed)
  Subjective:  Patient ID: Frank [name], male    DOB: August 25, 1951,  MRN: [id number]  Chief Complaint  Patient presents with  . routine foot care    Nail trim    70 y.o. male returns for the above complaint.  Patient presents with thickened elongated dystrophic toenails x10.  They are painful when ambulating.  Patient states he is unable to cut them would like to have [place] debrided them down.  He denies any other acute complaints.  He states that he is not a diabetic.   Objective:  There were no vitals filed for this visit. Podiatric Exam: Vascular: dorsalis pedis and posterior tibial pulses are palpable bilateral. Capillary return is immediate. Temperature gradient is WNL. Skin turgor WNL  Sensorium: Normal Semmes Weinstein monofilament test. Normal tactile sensation bilaterally. Nail Exam: Pt has thick disfigured discolored nails with subungual debris noted bilateral entire nail hallux through fifth toenails Ulcer Exam: There is no evidence of ulcer or pre-ulcerative changes or infection. Orthopedic Exam: Muscle tone and strength are WNL. No limitations in general ROM. No crepitus or effusions noted. HAV  B/L.  Hammer toes 2-5  B/L. Skin: No Porokeratosis. No infection or ulcers.  Mild xerosis/dry skin noted to bilateral lower extremity  Assessment & Plan:  Patient was evaluated and treated and all questions answered.  Xerosis bilateral mild -I explained to the patient the etiology of xerosis and various treatment options were extensively discussed.  I explained to the patient the importance of maintaining moisturization of the skin with application of over-the-counter lotion such as Eucerin or Luciderm.  I have asked the patient to apply this twice a day.  If unable to resolve patient will benefit from prescription lotion.   Onychomycosis with pain  -Nails palliatively debrided as below. -Educated on self-care  Procedure: Nail Debridement Rationale: pain  Type of Debridement:  manual, sharp debridement. Instrumentation: Nail nipper, rotary burr. Number of Nails: 10  Procedures and Treatment: Consent by patient was obtained for treatment procedures. The patient understood the discussion of treatment and procedures well. All questions were answered thoroughly reviewed. Debridement of mycotic and hypertrophic toenails, 1 through 5 bilateral and clearing of subungual debris. No ulceration, no infection noted.  Return Visit-Office Procedure: Patient instructed to return to the office for a follow up visit 3 months for continued evaluation and treatment.  Nicholes Rough, DPM    No follow-ups on file.

## 2020-10-31 ENCOUNTER — Ambulatory Visit: Payer: Self-pay | Admitting: Orthopedic Surgery

## 2021-01-06 ENCOUNTER — Encounter: Payer: Self-pay | Admitting: Podiatry

## 2021-01-06 ENCOUNTER — Other Ambulatory Visit: Payer: Self-pay

## 2021-01-06 ENCOUNTER — Ambulatory Visit: Payer: Medicare HMO | Admitting: Podiatry

## 2021-01-06 DIAGNOSIS — M79674 Pain in right toe(s): Secondary | ICD-10-CM

## 2021-01-06 DIAGNOSIS — M79675 Pain in left toe(s): Secondary | ICD-10-CM | POA: Diagnosis not present

## 2021-01-06 DIAGNOSIS — B351 Tinea unguium: Secondary | ICD-10-CM | POA: Diagnosis not present

## 2021-01-10 ENCOUNTER — Encounter: Payer: Self-pay | Admitting: Podiatry

## 2021-01-10 NOTE — Progress Notes (Signed)
  Subjective:  Patient ID: Frank [name], male    DOB: 03-Sep-1951,  MRN: [id number]  No chief complaint on file.  70 y.o. male returns for the above complaint.  Patient presents with thickened elongated dystrophic toenails x10.  They are painful when ambulating.  Patient states he is unable to cut them would like to have [place] debrided them down.  He denies any other acute complaints.  He states that he is not a diabetic.   Objective:  There were no vitals filed for this visit. Podiatric Exam: Vascular: dorsalis pedis and posterior tibial pulses are palpable bilateral. Capillary return is immediate. Temperature gradient is WNL. Skin turgor WNL  Sensorium: Normal Semmes Weinstein monofilament test. Normal tactile sensation bilaterally. Nail Exam: Pt has thick disfigured discolored nails with subungual debris noted bilateral entire nail hallux through fifth toenails Ulcer Exam: There is no evidence of ulcer or pre-ulcerative changes or infection. Orthopedic Exam: Muscle tone and strength are WNL. No limitations in general ROM. No crepitus or effusions noted. HAV  B/L.  Hammer toes 2-5  B/L. Skin: No Porokeratosis. No infection or ulcers.  Mild xerosis/dry skin noted to bilateral lower extremity  Assessment & Plan:  Patient was evaluated and treated and all questions answered.  Xerosis bilateral mild -I explained to the patient the etiology of xerosis and various treatment options were extensively discussed.  I explained to the patient the importance of maintaining moisturization of the skin with application of over-the-counter lotion such as Eucerin or Luciderm.  I have asked the patient to apply this twice a day.  If unable to resolve patient will benefit from prescription lotion.   Onychomycosis with pain  -Nails palliatively debrided as below. -Educated on self-care  Procedure: Nail Debridement Rationale: pain  Type of Debridement: manual, sharp debridement. Instrumentation: Nail  nipper, rotary burr. Number of Nails: 10  Procedures and Treatment: Consent by patient was obtained for treatment procedures. The patient understood the discussion of treatment and procedures well. All questions were answered thoroughly reviewed. Debridement of mycotic and hypertrophic toenails, 1 through 5 bilateral and clearing of subungual debris. No ulceration, no infection noted.  Return Visit-Office Procedure: Patient instructed to return to the office for a follow up visit 3 months for continued evaluation and treatment.  Nicholes Rough, DPM    Return in about 3 months (around 04/07/2021) for [facility].

## 2021-04-07 ENCOUNTER — Ambulatory Visit: Payer: Medicare HMO | Admitting: Podiatry

## 2021-04-07 ENCOUNTER — Other Ambulatory Visit: Payer: Self-pay

## 2021-04-07 ENCOUNTER — Encounter: Payer: Self-pay | Admitting: Podiatry

## 2021-04-07 DIAGNOSIS — M79675 Pain in left toe(s): Secondary | ICD-10-CM | POA: Diagnosis not present

## 2021-04-07 DIAGNOSIS — M79674 Pain in right toe(s): Secondary | ICD-10-CM

## 2021-04-07 DIAGNOSIS — B351 Tinea unguium: Secondary | ICD-10-CM

## 2021-04-07 NOTE — Progress Notes (Signed)
This patient returns to the office for evaluation and treatment of long thick painful nails .  This patient is unable to trim his own nails since the patient cannot reach his feet.  Patient says the nails are painful walking and wearing his shoes.  He returns for preventive foot care services.  General Appearance  Alert, conversant and in no acute stress.  Vascular  Dorsalis pedis and posterior tibial  pulses are palpable  bilaterally.  Capillary return is within normal limits  bilaterally. Temperature is within normal limits  bilaterally.  Neurologic  Senn-Weinstein monofilament wire test within normal limits  bilaterally. Muscle power within normal limits bilaterally.  Nails Thick disfigured discolored nails with subungual debris  from hallux to fifth toes bilaterally. No evidence of bacterial infection or drainage bilaterally.  Orthopedic  No limitations of motion  feet .  No crepitus or effusions noted.  No bony pathology or digital deformities noted.  HAV  B/L.  Hammer toes  B/L.  Skin  normotropic skin with no porokeratosis noted bilaterally.  No signs of infections or ulcers noted.     Onychomycosis  Pain in toes right foot  Pain in toes left foot  Debridement  of nails  1-5  B/L with a nail nipper.  Nails were then filed using a dremel tool with no incidents.    RTC  3 months    Isidro Monks DPM   

## 2021-07-14 ENCOUNTER — Encounter: Payer: Self-pay | Admitting: Podiatry

## 2021-07-14 ENCOUNTER — Other Ambulatory Visit: Payer: Self-pay

## 2021-07-14 ENCOUNTER — Ambulatory Visit: Payer: Medicare HMO | Admitting: Podiatry

## 2021-07-14 DIAGNOSIS — M79674 Pain in right toe(s): Secondary | ICD-10-CM

## 2021-07-14 DIAGNOSIS — M79675 Pain in left toe(s): Secondary | ICD-10-CM | POA: Diagnosis not present

## 2021-07-14 DIAGNOSIS — B351 Tinea unguium: Secondary | ICD-10-CM

## 2021-07-14 NOTE — Progress Notes (Signed)
This patient returns to the office for evaluation and treatment of long thick painful nails .  This patient is unable to trim his own nails since the patient cannot reach his feet.  Patient says the nails are painful walking and wearing his shoes.  He returns for preventive foot care services.  General Appearance  Alert, conversant and in no acute stress.  Vascular  Dorsalis pedis and posterior tibial  pulses are palpable  bilaterally.  Capillary return is within normal limits  bilaterally. Temperature is within normal limits  bilaterally.  Neurologic  Senn-Weinstein monofilament wire test within normal limits  bilaterally. Muscle power within normal limits bilaterally.  Nails Thick disfigured discolored nails with subungual debris  from hallux to fifth toes bilaterally. No evidence of bacterial infection or drainage bilaterally.  Orthopedic  No limitations of motion  feet .  No crepitus or effusions noted.  No bony pathology or digital deformities noted.  HAV  B/L.  Hammer toes  B/L.  Skin  normotropic skin with no porokeratosis noted bilaterally.  No signs of infections or ulcers noted.     Onychomycosis  Pain in toes right foot  Pain in toes left foot  Debridement  of nails  1-5  B/L with a nail nipper.  Nails were then filed using a dremel tool with no incidents.    RTC  3 months    Alenna Russell DPM   

## 2021-10-18 ENCOUNTER — Encounter: Payer: Self-pay | Admitting: Podiatry

## 2021-10-18 ENCOUNTER — Other Ambulatory Visit: Payer: Self-pay

## 2021-10-18 ENCOUNTER — Ambulatory Visit (INDEPENDENT_AMBULATORY_CARE_PROVIDER_SITE_OTHER): Payer: Medicare HMO | Admitting: Podiatry

## 2021-10-18 DIAGNOSIS — M79675 Pain in left toe(s): Secondary | ICD-10-CM | POA: Diagnosis not present

## 2021-10-18 DIAGNOSIS — M79674 Pain in right toe(s): Secondary | ICD-10-CM

## 2021-10-18 DIAGNOSIS — B354 Tinea corporis: Secondary | ICD-10-CM | POA: Insufficient documentation

## 2021-10-18 DIAGNOSIS — B351 Tinea unguium: Secondary | ICD-10-CM | POA: Diagnosis not present

## 2021-10-18 NOTE — Progress Notes (Signed)
This patient returns to the office for evaluation and treatment of long thick painful nails .  This patient is unable to trim his own nails since the patient cannot reach his feet.  Patient says the nails are painful walking and wearing his shoes.  He returns for preventive foot care services.  General Appearance  Alert, conversant and in no acute stress.  Vascular  Dorsalis pedis and posterior tibial  pulses are palpable  bilaterally.  Capillary return is within normal limits  bilaterally. Temperature is within normal limits  bilaterally.  Neurologic  Senn-Weinstein monofilament wire test within normal limits  bilaterally. Muscle power within normal limits bilaterally.  Nails Thick disfigured discolored nails with subungual debris  from hallux to fifth toes bilaterally. No evidence of bacterial infection or drainage bilaterally.  Orthopedic  No limitations of motion  feet .  No crepitus or effusions noted.  No bony pathology or digital deformities noted.  HAV  B/L.  Hammer toes  B/L.  Skin  normotropic skin with no porokeratosis noted bilaterally.  No signs of infections or ulcers noted.     Onychomycosis  Pain in toes right foot  Pain in toes left foot  Debridement  of nails  1-5  B/L with a nail nipper.  Nails were then filed using a dremel tool with no incidents.    RTC  3 months    Frank [name] DPM   

## 2022-01-17 ENCOUNTER — Ambulatory Visit: Payer: Medicare HMO | Admitting: Podiatry

## 2022-01-17 DIAGNOSIS — B351 Tinea unguium: Secondary | ICD-10-CM

## 2022-01-17 DIAGNOSIS — M79674 Pain in right toe(s): Secondary | ICD-10-CM

## 2022-01-17 DIAGNOSIS — M79675 Pain in left toe(s): Secondary | ICD-10-CM

## 2022-01-17 NOTE — Progress Notes (Signed)
This patient returns to the office for evaluation and treatment of long thick painful nails .  This patient is unable to trim his own nails since the patient cannot reach his feet.  Patient says the nails are painful walking and wearing his shoes.  He returns for preventive foot care services.  General Appearance  Alert, conversant and in no acute stress.  Vascular  Dorsalis pedis and posterior tibial  pulses are palpable  bilaterally.  Capillary return is within normal limits  bilaterally. Temperature is within normal limits  bilaterally.  Neurologic  Senn-Weinstein monofilament wire test within normal limits  bilaterally. Muscle power within normal limits bilaterally.  Nails Thick disfigured discolored nails with subungual debris  from hallux to fifth toes bilaterally. No evidence of bacterial infection or drainage bilaterally.  Orthopedic  No limitations of motion  feet .  No crepitus or effusions noted.  No bony pathology or digital deformities noted.  HAV  B/L.  Hammer toes  B/L.  Skin  normotropic skin with no porokeratosis noted bilaterally.  No signs of infections or ulcers noted.     Onychomycosis  Pain in toes right foot  Pain in toes left foot  Debridement  of nails  1-5  B/L with a nail nipper.  Nails were then filed using a dremel tool with no incidents.    RTC  3 months    Selden Noteboom DPM   

## 2022-04-18 ENCOUNTER — Ambulatory Visit: Payer: Medicare HMO | Admitting: Podiatry

## 2022-04-18 ENCOUNTER — Encounter: Payer: Self-pay | Admitting: Podiatry

## 2022-04-18 DIAGNOSIS — B351 Tinea unguium: Secondary | ICD-10-CM | POA: Diagnosis not present

## 2022-04-18 DIAGNOSIS — M79674 Pain in right toe(s): Secondary | ICD-10-CM

## 2022-04-18 DIAGNOSIS — M79675 Pain in left toe(s): Secondary | ICD-10-CM

## 2022-04-18 NOTE — Progress Notes (Signed)
This patient returns to the office for evaluation and treatment of long thick painful nails .  This patient is unable to trim his own nails since the patient cannot reach his feet.  Patient says the nails are painful walking and wearing his shoes.  He returns for preventive foot care services.  General Appearance  Alert, conversant and in no acute stress.  Vascular  Dorsalis pedis and posterior tibial  pulses are palpable  bilaterally.  Capillary return is within normal limits  bilaterally. Temperature is within normal limits  bilaterally.  Neurologic  Senn-Weinstein monofilament wire test within normal limits  bilaterally. Muscle power within normal limits bilaterally.  Nails Thick disfigured discolored nails with subungual debris  from hallux to fifth toes bilaterally. No evidence of bacterial infection or drainage bilaterally.  Orthopedic  No limitations of motion  feet .  No crepitus or effusions noted.  No bony pathology or digital deformities noted.  HAV  B/L.  Hammer toes  B/L.  Skin  normotropic skin with no porokeratosis noted bilaterally.  No signs of infections or ulcers noted.     Onychomycosis  Pain in toes right foot  Pain in toes left foot  Debridement  of nails  1-5  B/L with a nail nipper.  Nails were then filed using a dremel tool with no incidents.    RTC  3 months    Cristofher Livecchi DPM   

## 2022-07-25 ENCOUNTER — Ambulatory Visit: Payer: Medicare HMO | Admitting: Podiatry

## 2022-07-25 ENCOUNTER — Encounter: Payer: Self-pay | Admitting: Podiatry

## 2022-07-25 DIAGNOSIS — B351 Tinea unguium: Secondary | ICD-10-CM | POA: Diagnosis not present

## 2022-07-25 DIAGNOSIS — M79674 Pain in right toe(s): Secondary | ICD-10-CM | POA: Diagnosis not present

## 2022-07-25 DIAGNOSIS — M79675 Pain in left toe(s): Secondary | ICD-10-CM | POA: Diagnosis not present

## 2022-07-25 NOTE — Progress Notes (Signed)
This patient returns to the office for evaluation and treatment of long thick painful nails .  This patient is unable to trim his own nails since the patient cannot reach his feet.  Patient says the nails are painful walking and wearing his shoes.  He returns for preventive foot care services.  General Appearance  Alert, conversant and in no acute stress.  Vascular  Dorsalis pedis and posterior tibial  pulses are palpable  bilaterally.  Capillary return is within normal limits  bilaterally. Temperature is within normal limits  bilaterally.  Neurologic  Senn-Weinstein monofilament wire test within normal limits  bilaterally. Muscle power within normal limits bilaterally.  Nails Thick disfigured discolored nails with subungual debris  from hallux to fifth toes bilaterally. No evidence of bacterial infection or drainage bilaterally.  Orthopedic  No limitations of motion  feet .  No crepitus or effusions noted.  No bony pathology or digital deformities noted.  HAV  B/L.  Hammer toes  B/L.  Skin  normotropic skin with no porokeratosis noted bilaterally.  No signs of infections or ulcers noted.     Onychomycosis  Pain in toes right foot  Pain in toes left foot  Debridement  of nails  1-5  B/L with a nail nipper.  Nails were then filed using a dremel tool with no incidents.    RTC  3 months    Frank [name] DPM   

## 2022-10-29 ENCOUNTER — Encounter: Payer: Self-pay | Admitting: Podiatry

## 2022-10-29 ENCOUNTER — Ambulatory Visit: Payer: Medicare HMO | Admitting: Podiatry

## 2022-10-29 DIAGNOSIS — B351 Tinea unguium: Secondary | ICD-10-CM

## 2022-10-29 DIAGNOSIS — M79674 Pain in right toe(s): Secondary | ICD-10-CM

## 2022-10-29 DIAGNOSIS — M79675 Pain in left toe(s): Secondary | ICD-10-CM | POA: Diagnosis not present

## 2022-10-29 NOTE — Progress Notes (Signed)
This patient returns to the office for evaluation and treatment of long thick painful nails .  This patient is unable to trim his own nails since the patient cannot reach his feet.  Patient says the nails are painful walking and wearing his shoes.  He returns for preventive foot care services.  General Appearance  Alert, conversant and in no acute stress.  Vascular  Dorsalis pedis and posterior tibial  pulses are palpable  bilaterally.  Capillary return is within normal limits  bilaterally. Temperature is within normal limits  bilaterally.  Neurologic  Senn-Weinstein monofilament wire test within normal limits  bilaterally. Muscle power within normal limits bilaterally.  Nails Thick disfigured discolored nails with subungual debris  from hallux to fifth toes bilaterally. No evidence of bacterial infection or drainage bilaterally.  Orthopedic  No limitations of motion  feet .  No crepitus or effusions noted.  No bony pathology or digital deformities noted.  HAV  B/L.  Hammer toes  B/L.  Skin  normotropic skin with no porokeratosis noted bilaterally.  No signs of infections or ulcers noted.     Onychomycosis  Pain in toes right foot  Pain in toes left foot  Debridement  of nails  1-5  B/L with a nail nipper.  Nails were then filed using a dremel tool with no incidents.    RTC  3 months    Gardiner Barefoot DPM

## 2023-01-28 ENCOUNTER — Ambulatory Visit: Payer: Medicare HMO | Admitting: Podiatry

## 2023-01-28 ENCOUNTER — Encounter: Payer: Self-pay | Admitting: Podiatry

## 2023-01-28 DIAGNOSIS — B351 Tinea unguium: Secondary | ICD-10-CM | POA: Diagnosis not present

## 2023-01-28 DIAGNOSIS — M79675 Pain in left toe(s): Secondary | ICD-10-CM

## 2023-01-28 DIAGNOSIS — M79674 Pain in right toe(s): Secondary | ICD-10-CM

## 2023-01-28 NOTE — Progress Notes (Signed)
This patient returns to the office for evaluation and treatment of long thick painful nails .  This patient is unable to trim his own nails since the patient cannot reach his feet.  Patient says the nails are painful walking and wearing his shoes.  He returns for preventive foot care services.  General Appearance  Alert, conversant and in no acute stress.  Vascular  Dorsalis pedis and posterior tibial  pulses are palpable  bilaterally.  Capillary return is within normal limits  bilaterally. Temperature is within normal limits  bilaterally.  Neurologic  Senn-Weinstein monofilament wire test within normal limits  bilaterally. Muscle power within normal limits bilaterally.  Nails Thick disfigured discolored nails with subungual debris  from hallux to fifth toes bilaterally. No evidence of bacterial infection or drainage bilaterally.  Orthopedic  No limitations of motion  feet .  No crepitus or effusions noted.  No bony pathology or digital deformities noted.  HAV  B/L.  Hammer toes  B/L.  Skin  normotropic skin with no porokeratosis noted bilaterally.  No signs of infections or ulcers noted.     Onychomycosis  Pain in toes right foot  Pain in toes left foot  Debridement  of nails  1-5  B/L with a nail nipper.  Nails were then filed using a dremel tool with no incidents.    RTC  3 months    Michiah Masse DPM   

## 2023-05-02 ENCOUNTER — Encounter: Payer: Self-pay | Admitting: Podiatry

## 2023-05-02 ENCOUNTER — Ambulatory Visit: Payer: Medicare HMO | Admitting: Podiatry

## 2023-05-02 DIAGNOSIS — B351 Tinea unguium: Secondary | ICD-10-CM | POA: Diagnosis not present

## 2023-05-02 DIAGNOSIS — M79675 Pain in left toe(s): Secondary | ICD-10-CM | POA: Diagnosis not present

## 2023-05-02 DIAGNOSIS — M79674 Pain in right toe(s): Secondary | ICD-10-CM

## 2023-05-02 NOTE — Progress Notes (Signed)
This patient returns to the office for evaluation and treatment of long thick painful nails .  This patient is unable to trim his own nails since the patient cannot reach his feet.  Patient says the nails are painful walking and wearing his shoes.  He returns for preventive foot care services.  General Appearance  Alert, conversant and in no acute stress.  Vascular  Dorsalis pedis and posterior tibial  pulses are palpable  bilaterally.  Capillary return is within normal limits  bilaterally. Temperature is within normal limits  bilaterally.  Neurologic  Senn-Weinstein monofilament wire test within normal limits  bilaterally. Muscle power within normal limits bilaterally.  Nails Thick disfigured discolored nails with subungual debris  from hallux to fifth toes bilaterally. No evidence of bacterial infection or drainage bilaterally.  Orthopedic  No limitations of motion  feet .  No crepitus or effusions noted.  No bony pathology or digital deformities noted.  HAV  B/L.  Hammer toes  B/L.  Skin  normotropic skin with no porokeratosis noted bilaterally.  No signs of infections or ulcers noted.     Onychomycosis  Pain in toes right foot  Pain in toes left foot  Debridement  of nails  1-5  B/L with a nail nipper.  Nails were then filed using a dremel tool with no incidents.    RTC  3 months    Gregory Mayer DPM   

## 2023-08-02 ENCOUNTER — Ambulatory Visit (INDEPENDENT_AMBULATORY_CARE_PROVIDER_SITE_OTHER): Payer: Medicare HMO | Admitting: Podiatry

## 2023-08-02 ENCOUNTER — Encounter: Payer: Self-pay | Admitting: Podiatry

## 2023-08-02 DIAGNOSIS — M79675 Pain in left toe(s): Secondary | ICD-10-CM | POA: Diagnosis not present

## 2023-08-02 DIAGNOSIS — B351 Tinea unguium: Secondary | ICD-10-CM | POA: Diagnosis not present

## 2023-08-02 DIAGNOSIS — M79674 Pain in right toe(s): Secondary | ICD-10-CM

## 2023-08-02 NOTE — Progress Notes (Signed)
This patient returns to the office for evaluation and treatment of long thick painful nails .  This patient is unable to trim his own nails since the patient cannot reach his feet.  Patient says the nails are painful walking and wearing his shoes.  He returns for preventive foot care services.  General Appearance  Alert, conversant and in no acute stress.  Vascular  Dorsalis pedis and posterior tibial  pulses are palpable  bilaterally.  Capillary return is within normal limits  bilaterally. Temperature is within normal limits  bilaterally.  Neurologic  Senn-Weinstein monofilament wire test within normal limits  bilaterally. Muscle power within normal limits bilaterally.  Nails Thick disfigured discolored nails with subungual debris  from hallux to fifth toes bilaterally. No evidence of bacterial infection or drainage bilaterally.  Orthopedic  No limitations of motion  feet .  No crepitus or effusions noted.  No bony pathology or digital deformities noted.  HAV  B/L.  Hammer toes  B/L.  Skin  normotropic skin with no porokeratosis noted bilaterally.  No signs of infections or ulcers noted.     Onychomycosis  Pain in toes right foot  Pain in toes left foot  Debridement  of nails  1-5  B/L with a nail nipper.  Nails were then filed using a dremel tool with no incidents.    RTC  3 months    Helane Gunther DPM

## 2023-11-04 ENCOUNTER — Encounter: Payer: Self-pay | Admitting: Podiatry

## 2023-11-04 ENCOUNTER — Ambulatory Visit: Payer: Medicare HMO | Admitting: Podiatry

## 2023-11-04 DIAGNOSIS — B351 Tinea unguium: Secondary | ICD-10-CM | POA: Diagnosis not present

## 2023-11-04 DIAGNOSIS — M79674 Pain in right toe(s): Secondary | ICD-10-CM

## 2023-11-04 DIAGNOSIS — M79675 Pain in left toe(s): Secondary | ICD-10-CM

## 2023-11-04 NOTE — Progress Notes (Signed)
 This patient returns to the office for evaluation and treatment of long thick painful nails .  This patient is unable to trim his own nails since the patient cannot reach his feet.  Patient says the nails are painful walking and wearing his shoes.  He returns for preventive foot care services.  General Appearance  Alert, conversant and in no acute stress.  Vascular  Dorsalis pedis and posterior tibial  pulses are palpable  bilaterally.  Capillary return is within normal limits  bilaterally. Temperature is within normal limits  bilaterally.  Neurologic  Senn-Weinstein monofilament wire test within normal limits  bilaterally. Muscle power within normal limits bilaterally.  Nails Thick disfigured discolored nails with subungual debris  from hallux to fifth toes bilaterally. No evidence of bacterial infection or drainage bilaterally.  Orthopedic  No limitations of motion  feet .  No crepitus or effusions noted.  No bony pathology or digital deformities noted.  HAV  B/L.  Hammer toes  B/L.  Skin  normotropic skin with no porokeratosis noted bilaterally.  No signs of infections or ulcers noted.     Onychomycosis  Pain in toes right foot  Pain in toes left foot  Debridement  of nails  1-5  B/L with a nail nipper.  Nails were then filed using a dremel tool with no incidents.    RTC  3 months    Helane Gunther DPM

## 2024-02-05 ENCOUNTER — Encounter: Payer: Self-pay | Admitting: Podiatry

## 2024-02-05 ENCOUNTER — Ambulatory Visit: Payer: Medicare HMO | Admitting: Podiatry

## 2024-02-05 DIAGNOSIS — M79674 Pain in right toe(s): Secondary | ICD-10-CM | POA: Diagnosis not present

## 2024-02-05 DIAGNOSIS — B351 Tinea unguium: Secondary | ICD-10-CM

## 2024-02-05 DIAGNOSIS — M79675 Pain in left toe(s): Secondary | ICD-10-CM

## 2024-02-05 NOTE — Progress Notes (Signed)
 This patient returns to the office for evaluation and treatment of long thick painful nails .  This patient is unable to trim his own nails since the patient cannot reach his feet.  Patient says the nails are painful walking and wearing his shoes.  He returns for preventive foot care services.  General Appearance  Alert, conversant and in no acute stress.  Vascular  Dorsalis pedis and posterior tibial  pulses are palpable  bilaterally.  Capillary return is within normal limits  bilaterally. Temperature is within normal limits  bilaterally.  Neurologic  Senn-Weinstein monofilament wire test within normal limits  bilaterally. Muscle power within normal limits bilaterally.  Nails Thick disfigured discolored nails with subungual debris  from hallux to fifth toes bilaterally. No evidence of bacterial infection or drainage bilaterally.  Orthopedic  No limitations of motion  feet .  No crepitus or effusions noted.  No bony pathology or digital deformities noted.  HAV  B/L.  Hammer toes  B/L.  Skin  normotropic skin with no porokeratosis noted bilaterally.  No signs of infections or ulcers noted.     Onychomycosis  Pain in toes right foot  Pain in toes left foot  Debridement  of nails  1-5  B/L with a nail nipper.  Nails were then filed using a dremel tool with no incidents.    RTC  3 months    Helane Gunther DPM

## 2024-05-07 ENCOUNTER — Encounter: Payer: Self-pay | Admitting: Podiatry

## 2024-05-07 ENCOUNTER — Ambulatory Visit: Admitting: Podiatry

## 2024-05-07 DIAGNOSIS — B351 Tinea unguium: Secondary | ICD-10-CM

## 2024-05-07 DIAGNOSIS — M79675 Pain in left toe(s): Secondary | ICD-10-CM | POA: Diagnosis not present

## 2024-05-07 DIAGNOSIS — M79674 Pain in right toe(s): Secondary | ICD-10-CM

## 2024-05-07 NOTE — Progress Notes (Signed)
 This patient returns to the office for evaluation and treatment of long thick painful nails .  This patient is unable to trim his own nails since the patient cannot reach his feet.  Patient says the nails are painful walking and wearing his shoes.  He returns for preventive foot care services.  General Appearance  Alert, conversant and in no acute stress.  Vascular  Dorsalis pedis and posterior tibial  pulses are palpable  bilaterally.  Capillary return is within normal limits  bilaterally. Temperature is within normal limits  bilaterally.  Neurologic  Senn-Weinstein monofilament wire test within normal limits  bilaterally. Muscle power within normal limits bilaterally.  Nails Thick disfigured discolored nails with subungual debris  from hallux to fifth toes bilaterally. No evidence of bacterial infection or drainage bilaterally.  Orthopedic  No limitations of motion  feet .  No crepitus or effusions noted.  No bony pathology or digital deformities noted.  HAV  B/L.  Hammer toes  B/L.  Skin  normotropic skin with no porokeratosis noted bilaterally.  No signs of infections or ulcers noted.     Onychomycosis  Pain in toes right foot  Pain in toes left foot  Debridement  of nails  1-5  B/L with a nail nipper.  Nails were then filed using a dremel tool with no incidents.    RTC  3 months    Helane Gunther DPM

## 2024-07-31 ENCOUNTER — Ambulatory Visit: Admitting: Podiatry

## 2024-07-31 ENCOUNTER — Encounter: Payer: Self-pay | Admitting: Podiatry

## 2024-07-31 DIAGNOSIS — M79675 Pain in left toe(s): Secondary | ICD-10-CM | POA: Diagnosis not present

## 2024-07-31 DIAGNOSIS — M79674 Pain in right toe(s): Secondary | ICD-10-CM | POA: Diagnosis not present

## 2024-07-31 DIAGNOSIS — B351 Tinea unguium: Secondary | ICD-10-CM

## 2024-07-31 NOTE — Progress Notes (Signed)
 This patient returns to the office for evaluation and treatment of long thick painful nails .  This patient is unable to trim his own nails since the patient cannot reach his feet.  Patient says the nails are painful walking and wearing his shoes.  He returns for preventive foot care services.  General Appearance  Alert, conversant and in no acute stress.  Vascular  Dorsalis pedis and posterior tibial  pulses are palpable  bilaterally.  Capillary return is within normal limits  bilaterally. Temperature is within normal limits  bilaterally.  Neurologic  Senn-Weinstein monofilament wire test within normal limits  bilaterally. Muscle power within normal limits bilaterally.  Nails Thick disfigured discolored nails with subungual debris  from hallux to fifth toes bilaterally. No evidence of bacterial infection or drainage bilaterally.  Orthopedic  No limitations of motion  feet .  No crepitus or effusions noted.  No bony pathology or digital deformities noted.  HAV  B/L.  Hammer toes  B/L.  Skin  normotropic skin with no porokeratosis noted bilaterally.  No signs of infections or ulcers noted.     Onychomycosis  Pain in toes right foot  Pain in toes left foot  Debridement  of nails  1-5  B/L with a nail nipper.  Nails were then filed using a dremel tool with no incidents.    RTC  3 months    Helane Gunther DPM

## 2024-10-30 ENCOUNTER — Ambulatory Visit: Admitting: Podiatry
